# Patient Record
Sex: Female | Born: 1975
Health system: Southern US, Community
[De-identification: ages and names within clinical notes are randomized; demographics above are authoritative.]

## PROBLEM LIST (undated history)

## (undated) DIAGNOSIS — F329 Major depressive disorder, single episode, unspecified: Secondary | ICD-10-CM

## (undated) DIAGNOSIS — E079 Disorder of thyroid, unspecified: Secondary | ICD-10-CM

## (undated) DIAGNOSIS — D689 Coagulation defect, unspecified: Secondary | ICD-10-CM

## (undated) DIAGNOSIS — F419 Anxiety disorder, unspecified: Secondary | ICD-10-CM

## (undated) DIAGNOSIS — K760 Fatty (change of) liver, not elsewhere classified: Secondary | ICD-10-CM

## (undated) DIAGNOSIS — F32A Depression, unspecified: Secondary | ICD-10-CM

## (undated) DIAGNOSIS — C649 Malignant neoplasm of unspecified kidney, except renal pelvis: Secondary | ICD-10-CM

## (undated) DIAGNOSIS — D6851 Activated protein C resistance: Secondary | ICD-10-CM

## (undated) HISTORY — PX: EXTERNAL EAR SURGERY: SHX627

## (undated) HISTORY — DX: Malignant neoplasm of unspecified kidney, except renal pelvis: C64.9

## (undated) HISTORY — DX: Major depressive disorder, single episode, unspecified: F32.9

## (undated) HISTORY — DX: Anxiety disorder, unspecified: F41.9

## (undated) HISTORY — DX: Coagulation defect, unspecified: D68.9

## (undated) HISTORY — DX: Activated protein C resistance: D68.51

## (undated) HISTORY — PX: TYMPANOPLASTY: SHX33

## (undated) HISTORY — DX: Disorder of thyroid, unspecified: E07.9

## (undated) HISTORY — DX: Depression, unspecified: F32.A

## (undated) HISTORY — DX: Fatty (change of) liver, not elsewhere classified: K76.0

---

## 2000-03-23 HISTORY — PX: THYROIDECTOMY: SHX17

## 2014-06-12 LAB — HM MAMMOGRAPHY: HM Mammogram: NORMAL (ref 0–4)

## 2014-09-27 LAB — HM PAP SMEAR

## 2015-04-03 ENCOUNTER — Encounter: Payer: Self-pay | Admitting: Obstetrics & Gynecology

## 2015-09-23 ENCOUNTER — Ambulatory Visit: Payer: 59 | Admitting: Family Medicine

## 2015-09-27 ENCOUNTER — Ambulatory Visit (INDEPENDENT_AMBULATORY_CARE_PROVIDER_SITE_OTHER): Payer: 59 | Admitting: Family Medicine

## 2015-09-27 ENCOUNTER — Encounter: Payer: Self-pay | Admitting: Family Medicine

## 2015-09-27 VITALS — BP 121/78 | HR 98 | Temp 98.1°F | Resp 16 | Ht 67.0 in | Wt 218.0 lb

## 2015-09-27 DIAGNOSIS — E669 Obesity, unspecified: Secondary | ICD-10-CM | POA: Diagnosis not present

## 2015-09-27 DIAGNOSIS — F329 Major depressive disorder, single episode, unspecified: Secondary | ICD-10-CM

## 2015-09-27 DIAGNOSIS — Z8669 Personal history of other diseases of the nervous system and sense organs: Secondary | ICD-10-CM | POA: Diagnosis not present

## 2015-09-27 DIAGNOSIS — E66813 Obesity, class 3: Secondary | ICD-10-CM | POA: Insufficient documentation

## 2015-09-27 DIAGNOSIS — E890 Postprocedural hypothyroidism: Secondary | ICD-10-CM

## 2015-09-27 DIAGNOSIS — L719 Rosacea, unspecified: Secondary | ICD-10-CM

## 2015-09-27 DIAGNOSIS — F32A Depression, unspecified: Secondary | ICD-10-CM

## 2015-09-27 DIAGNOSIS — E039 Hypothyroidism, unspecified: Secondary | ICD-10-CM | POA: Insufficient documentation

## 2015-09-27 LAB — HEPATIC FUNCTION PANEL
ALBUMIN: 4.4 g/dL (ref 3.5–5.2)
ALT: 51 U/L — ABNORMAL HIGH (ref 0–35)
AST: 29 U/L (ref 0–37)
Alkaline Phosphatase: 83 U/L (ref 39–117)
Bilirubin, Direct: 0.1 mg/dL (ref 0.0–0.3)
TOTAL PROTEIN: 6.8 g/dL (ref 6.0–8.3)
Total Bilirubin: 0.6 mg/dL (ref 0.2–1.2)

## 2015-09-27 LAB — BASIC METABOLIC PANEL
BUN: 13 mg/dL (ref 6–23)
CALCIUM: 9.1 mg/dL (ref 8.4–10.5)
CO2: 25 meq/L (ref 19–32)
CREATININE: 0.67 mg/dL (ref 0.40–1.20)
Chloride: 102 mEq/L (ref 96–112)
GFR: 104.76 mL/min (ref >60.00)
GLUCOSE: 93 mg/dL (ref 70–99)
Potassium: 4.2 mEq/L (ref 3.5–5.1)
Sodium: 135 mEq/L (ref 135–145)

## 2015-09-27 LAB — CBC WITH DIFFERENTIAL/PLATELET
BASOS ABS: 0 10*3/uL (ref 0.0–0.1)
Basophils Relative: 0.4 % (ref 0.0–3.0)
EOS ABS: 0 10*3/uL (ref 0.0–0.7)
Eosinophils Relative: 0.6 % (ref 0.0–5.0)
HEMATOCRIT: 44.6 % (ref 36.0–46.0)
HEMOGLOBIN: 15.1 g/dL — AB (ref 12.0–15.0)
LYMPHS PCT: 11.9 % — AB (ref 12.0–46.0)
Lymphs Abs: 0.7 10*3/uL (ref 0.7–4.0)
MCHC: 33.8 g/dL (ref 30.0–36.0)
MCV: 89.4 fl (ref 78.0–100.0)
Monocytes Absolute: 0.7 10*3/uL (ref 0.1–1.0)
Monocytes Relative: 10.8 % (ref 3.0–12.0)
Neutro Abs: 4.7 10*3/uL (ref 1.4–7.7)
Neutrophils Relative %: 76.3 % (ref 43.0–77.0)
Platelets: 280 10*3/uL (ref 150.0–400.0)
RBC: 4.99 Mil/uL (ref 3.87–5.11)
RDW: 13.1 % (ref 11.5–15.5)
WBC: 6.2 10*3/uL (ref 4.0–10.5)

## 2015-09-27 LAB — LIPID PANEL
CHOLESTEROL: 231 mg/dL — AB (ref 0–200)
HDL: 46.7 mg/dL (ref >39.00)
LDL CALC: 147 mg/dL — AB (ref 0–99)
NONHDL: 184.22
Total CHOL/HDL Ratio: 5
Triglycerides: 184 mg/dL — ABNORMAL HIGH (ref 0.0–149.0)
VLDL: 36.8 mg/dL (ref 0.0–40.0)

## 2015-09-27 LAB — TSH: TSH: 1.39 u[IU]/mL (ref 0.35–4.50)

## 2015-09-27 MED ORDER — METRONIDAZOLE 1 % EX GEL: Freq: Every day | CUTANEOUS | Status: DC

## 2015-09-27 MED ORDER — SERTRALINE HCL 25 MG PO TABS: 25.0000 mg | ORAL_TABLET | Freq: Every day | ORAL | Status: DC

## 2015-09-27 NOTE — Progress Notes (Signed)
   Subjective:    Patient ID: Martha Cervantes, female    DOB: 08-18-1975, 40 y.o.   MRN: [id number]  HPI New to establish.  Recently moved from [facility]  Hypothyroid- pt had thyroidectomy due to strong family hx of thyroid cancer.  She is now on Levothyroxine 155mcg daily.  + fatigue.  Denies changes to skin, hair, nails.  Denies constipation.  Depression- chronic problem, currently on Zoloft 25mg  daily.  + family hx of depression.  Pt reports Zoloft is helping w/ sleep and anxiety.  Due for refill on medication.  Skin changes- pt has noticed skin changes on jaw.  Worsens w/ abx use.  Doesn't itch.  + dry, thickened, flaky.  Dad w/ hx of rosacea.  Hx of migraines- pt has not had a headache in over a year.  Previously took a cocktail of Imitrex, Phenergan, Advil.  Review of Systems For ROS see HPI     Objective:   Physical Exam  Constitutional: She is oriented to person, place, and time. She appears well-developed and well-nourished. No distress.  HENT:  Head: Normocephalic and atraumatic.  TMs are scarred and retracted bilaterally  Eyes: Conjunctivae and EOM are normal. Pupils are equal, round, and reactive to light.  Neck: Normal range of motion. Neck supple. No thyromegaly present.  Cardiovascular: Normal rate, regular rhythm, normal heart sounds and intact distal pulses.   No murmur heard. Pulmonary/Chest: Effort normal and breath sounds normal. No respiratory distress.  Abdominal: Soft. She exhibits no distension. There is no tenderness.  Musculoskeletal: She exhibits no edema.  Lymphadenopathy:    She has no cervical adenopathy.  Neurological: She is alert and oriented to person, place, and time.  Skin: Skin is warm and dry. There is erythema (pt w/ rosacea of chin and nose).  Psychiatric: She has a normal mood and affect. Her behavior is normal.  Vitals reviewed.         Assessment & Plan:

## 2015-09-27 NOTE — Assessment & Plan Note (Signed)
New.  Check labs to risk stratify.  Stressed need for healthy diet and regular exercise.  Will follow.

## 2015-09-27 NOTE — Patient Instructions (Signed)
Schedule your complete physical in 6 months We'll notify you of your lab results and make any changes if needed Continue to work on healthy diet and regular exercise- this will also help w/ stress relief! Use the Metrogel once daily before bed- may initially cause increased drying but should even out over time We will refill the thyroid medication once we have your lab results Start a daily Claritin or Zyrtec and see if it improves your ear symptoms Call with any questions or concerns Welcome!  We're glad to have you!!!

## 2015-09-27 NOTE — Assessment & Plan Note (Signed)
New to provider.  Pt has not had a HA in over a year.  She feels starting Zoloft improved these.  Will tx prn.

## 2015-09-27 NOTE — Assessment & Plan Note (Signed)
New.  Pt's facial sxs are not consistent w/ reaction to amoxicillin as she was previously told (she has not been on medication recently).  Appearance is consistent w/ Rosacea- which her father also has.  Start Metrogel and if no improvement will refer to Derm.  Pt expressed understanding and is in agreement w/ plan.

## 2015-09-27 NOTE — Assessment & Plan Note (Signed)
New to provider, ongoing for pt.  Pt reports sxs are well controlled w/ Zoloft.  No med changes at this time.  Refill provided.

## 2015-09-27 NOTE — Assessment & Plan Note (Signed)
New to provider, ongoing for pt.  Pt feels she is recently much more fatigued.  Check TSH level and adjust meds prn.  Pt expressed understanding and is in agreement w/ plan.

## 2015-09-27 NOTE — Progress Notes (Signed)
Pre visit review using our clinic review tool, if applicable. No additional management support is needed unless otherwise documented below in the visit note. 

## 2015-09-30 ENCOUNTER — Encounter: Payer: Self-pay | Admitting: General Practice

## 2015-11-11 ENCOUNTER — Ambulatory Visit (INDEPENDENT_AMBULATORY_CARE_PROVIDER_SITE_OTHER): Payer: 59 | Admitting: Family Medicine

## 2015-11-11 ENCOUNTER — Encounter: Payer: Self-pay | Admitting: Family Medicine

## 2015-11-11 VITALS — BP 126/86 | HR 83 | Temp 98.0°F | Resp 16 | Ht 67.0 in | Wt 223.4 lb

## 2015-11-11 DIAGNOSIS — R51 Headache: Secondary | ICD-10-CM

## 2015-11-11 DIAGNOSIS — R519 Headache, unspecified: Secondary | ICD-10-CM

## 2015-11-11 DIAGNOSIS — G8929 Other chronic pain: Secondary | ICD-10-CM

## 2015-11-11 MED ORDER — CYCLOBENZAPRINE HCL 10 MG PO TABS: 10.0000 mg | ORAL_TABLET | Freq: Three times a day (TID) | ORAL | 0 refills | Status: DC | PRN

## 2015-11-11 MED ORDER — IBUPROFEN 800 MG PO TABS: 800.0000 mg | ORAL_TABLET | Freq: Three times a day (TID) | ORAL | 0 refills | Status: DC | PRN

## 2015-11-11 MED ORDER — SUMATRIPTAN SUCCINATE 50 MG PO TABS: 50.0000 mg | ORAL_TABLET | ORAL | 0 refills | Status: DC | PRN

## 2015-11-11 NOTE — Progress Notes (Signed)
   Subjective:    Patient ID: Martha Cervantes, female    DOB: [birth year], 40 y.o.   MRN: [id number]  HPI Migraines- pt reports she has had HAs since she was a child.  Chocolate is a trigger for pt.  Pt reports since she went to [employer] store in late July she has had a 'bad headache' w/ some dizziness, fatigue.  HA has waxed and waned but it has never resolved.  Pt reports the worst part is the fatigue.  Will develop eye pain when there is a lot of noise and activity.  'when I get one, I get really freaked out and think I have a brain tumor'.  Last physician gave her Phenergan, Depakote, Imitrex, and Advil 800mg  but she reports this did not resolve sxs and she didn't like how it made her feel.  Pt has never seen Neuro.  Pt denies nausea.  Pt reports this occurs ~1x/yr.  sxs improve w/ physical activity.   Review of Systems For ROS see HPI     Objective:   Physical Exam  Constitutional: She is oriented to person, place, and time. She appears well-developed and well-nourished. No distress.  obese  HENT:  Head: Normocephalic and atraumatic.  TMs WNL No TTP over sinuses Minimal nasal congestion  Eyes: Conjunctivae and EOM are normal. Pupils are equal, round, and reactive to light.  Neck: Normal range of motion. Neck supple.  Cardiovascular: Normal rate, regular rhythm, normal heart sounds and intact distal pulses.   Pulmonary/Chest: Effort normal and breath sounds normal. No respiratory distress. She has no wheezes. She has no rales.  Lymphadenopathy:    She has no cervical adenopathy.  Neurological: She is alert and oriented to person, place, and time. She has normal reflexes. No cranial nerve deficit. She exhibits normal muscle tone. Coordination normal.  Psychiatric: She has a normal mood and affect. Her behavior is normal. Judgment and thought content normal.  Vitals reviewed.         Assessment & Plan:  Intractable headache- new to provider, pt has hx of similar.  Pt reports migraines  since childhood but she has had a few of these episodes where she will have intractable headache for >1 month.  No relief w/ tylenol/ibuprofen.  She reports the worst part of these is the overwhelming fatigue and intermittent dizziness.  The headache she's describing may be tension related- start Flexeril.  Imitrex in case of migraines.  Since this is the 1st since her episode last year, a daily prophylaxis seems like overkill.  She has never seen neuro- referral placed.  Reviewed supportive care and red flags that should prompt return.  Pt expressed understanding and is in agreement w/ plan.

## 2015-11-11 NOTE — Progress Notes (Signed)
Pre visit review using our clinic review tool, if applicable. No additional management support is needed unless otherwise documented below in the visit note. 

## 2015-11-11 NOTE — Patient Instructions (Signed)
Follow up as needed We'll call you with your Neuro referral Take 1 imitrex today in combo with the Flexeril tonight and the 800mg  ibuprofen up to 3x/day (take w/ food) Repeat the Imitrex daily until headache resolves Drink plenty of fluids!!!  Dehydration can worsen headaches Start daily Claritin or Zyrtec to improve allergy symptoms Call with any questions or concerns Hang in there!!  You're not crazy!!!

## 2016-01-13 ENCOUNTER — Ambulatory Visit (INDEPENDENT_AMBULATORY_CARE_PROVIDER_SITE_OTHER): Payer: 59 | Admitting: Neurology

## 2016-01-13 ENCOUNTER — Encounter: Payer: Self-pay | Admitting: Neurology

## 2016-01-13 VITALS — BP 112/60 | HR 101 | Ht 67.0 in | Wt 223.0 lb

## 2016-01-13 DIAGNOSIS — G44219 Episodic tension-type headache, not intractable: Secondary | ICD-10-CM | POA: Diagnosis not present

## 2016-01-13 NOTE — Patient Instructions (Addendum)
I think you likely have tension type headaches, but migraine is still possible too. Try to focus on lifestyle modification.  If you would like, we can try a different antidepressant called nortriptyline (which is effective for migraine and tension headache).  Continue flexeril.  Tizanidine is another muscle relaxer too.   Migraine Headache A migraine headache is an intense, throbbing pain on one or both sides of your head. A migraine can last for 30 minutes to several hours. CAUSES  The exact cause of a migraine headache is not always known. However, a migraine may be caused when nerves in the brain become irritated and release chemicals that cause inflammation. This causes pain. Certain things may also trigger migraines, such as:  Alcohol.  Smoking.  Stress.  Menstruation.  Aged cheeses.  Foods or drinks that contain nitrates, glutamate, aspartame, or tyramine.  Lack of sleep.  Chocolate.  Caffeine.  Hunger.  Physical exertion.  Fatigue.  Medicines used to treat chest pain (nitroglycerine), birth control pills, estrogen, and some blood pressure medicines. SIGNS AND SYMPTOMS  Pain on one or both sides of your head.  Pulsating or throbbing pain.  Severe pain that prevents daily activities.  Pain that is aggravated by any physical activity.  Nausea, vomiting, or both.  Dizziness.  Pain with exposure to bright lights, loud noises, or activity.  General sensitivity to bright lights, loud noises, or smells. Before you get a migraine, you may get warning signs that a migraine is coming (aura). An aura may include:  Seeing flashing lights.  Seeing bright spots, halos, or zigzag lines.  Having tunnel vision or blurred vision.  Having feelings of numbness or tingling.  Having trouble talking.  Having muscle weakness. DIAGNOSIS  A migraine headache is often diagnosed based on:  Symptoms.  Physical exam.  A CT scan or MRI of your head. These imaging tests  cannot diagnose migraines, but they can help rule out other causes of headaches. TREATMENT Medicines may be given for pain and nausea. Medicines can also be given to help prevent recurrent migraines.  HOME CARE INSTRUCTIONS  Only take over-the-counter or prescription medicines for pain or discomfort as directed by your health care provider. The use of long-term narcotics is not recommended.  Lie down in a dark, quiet room when you have a migraine.  Keep a journal to find out what may trigger your migraine headaches. For example, write down:  What you eat and drink.  How much sleep you get.  Any change to your diet or medicines.  Limit alcohol consumption.  Quit smoking if you smoke.  Get 7-9 hours of sleep, or as recommended by your health care provider.  Limit stress.  Keep lights dim if bright lights bother you and make your migraines worse. SEEK IMMEDIATE MEDICAL CARE IF:   Your migraine becomes severe.  You have a fever.  You have a stiff neck.  You have vision loss.  You have muscular weakness or loss of muscle control.  You start losing your balance or have trouble walking.  You feel faint or pass out.  You have severe symptoms that are different from your first symptoms. MAKE SURE YOU:   Understand these instructions.  Will watch your condition.  Will get help right away if you are not doing well or get worse.   This information is not intended to replace advice given to you by your health care provider. Make sure you discuss any questions you have with your health care provider.  Document Released: 03/09/2005 Document Revised: 03/30/2014 Document Reviewed: 11/14/2012 Elsevier Interactive Patient Education [employer].

## 2016-01-13 NOTE — Progress Notes (Signed)
NEUROLOGY CONSULTATION NOTE  Decker Miggins MRN: [id number] DOB: Oct 04, 1975  Referring provider: Dr. Birdie Riddle Primary care provider: Dr. Birdie Riddle  Reason for consult:  headache  HISTORY OF PRESENT ILLNESS: Martha Cervantes is a 40 year old right-handed woman with secondary hypothyroidism who presents for migraines.  History obtained by patient and PCP note.  Onset:  She has had headaches since childhood.  In highschool and college, she had severe migraines associated with nausea, vomiting, photophobia and phonophobia.  She hasn't had those in several years.  In March 2017, she developed a headache that lasted 2 weeks.  At that time, she was under increased stress related to her marriage and caring for 2 young children.  She was treated with combination of Depakote, sumatriptan 50mg  and Phenergan, which was ineffective.  Following that, she has since had episodic headache. Location:  Varies (back of head, front, top of head) Quality:  Non-throbbing Intensity:  3-5/10 Aura:  no Prodrome:  no Associated symptoms:  Dizziness, fatigue Duration:  Several hours to days (works within one hour with Flexeril) Frequency:  Once a week (but can last 2 to 3 days sometimes) Triggers/exacerbating factors:  chocolate Relieving factors:  rest Activity:  Able to function with these headaches.  Past NSAIDS:  ibuprofen 800mg  Past analgesics:  Tylenol Past abortive triptans:  sumatriptan Past muscle relaxants:  no Past anti-emetic:  promethazine Past antihypertensive medications:  no Past antidepressant medications:  no Past anticonvulsant medications:  Depakote Past vitamins/Herbal/Supplements:  Magnesium, riboflavin Other past therapies:  no  Current NSAIDS:  no Current analgesics:  no Current triptans:  no Current anti-emetic:  no Current muscle relaxants:  cyclobenzaprine Current anti-anxiolytic:  no Current sleep aide:  no Current Antihypertensive medications:  no Current Antidepressant  medications:  Sertraline 25mg  (makes her drowsy, wants to discontinue) Current Anticonvulsant medications:  no Current Vitamins/Herbal/Supplements:  no Current Antihistamines/Decongestants:  Claritin/Zyrtec Other therapy:  no  Caffeine:  2-3 cups coffee daily Alcohol:  no Smoker:  no Diet:  hydrates Exercise:  no Depression/stress:  controlled Sleep hygiene:  good Family history of headache:  Maternal grandmother, cousin  Labs from July: CBC with WBC 6.2, HGB 15.1, HCT 44.6 and PLT 280; BMP with BUN 13, Cr 0.67 and GFR 104.76; hepatic panel with total bili 0.6, ALP 83, AST 29 and ALT 51.  PAST MEDICAL HISTORY: Past Medical History:  Diagnosis Date  . Depression   . Factor V Leiden ([place])   . Thyroid disease     PAST SURGICAL HISTORY: Past Surgical History:  Procedure Laterality Date  . EXTERNAL EAR SURGERY    . THYROIDECTOMY  2002  . TYMPANOPLASTY      MEDICATIONS: Current Outpatient Prescriptions on File Prior to Visit  Medication Sig Dispense Refill  . cyclobenzaprine (FLEXERIL) 10 MG tablet Take 1 tablet (10 mg total) by mouth 3 (three) times daily as needed for muscle spasms. 45 tablet 0  . levothyroxine (SYNTHROID, LEVOTHROID) 125 MCG tablet Take 125 mcg by mouth daily before breakfast.    . metroNIDAZOLE (METROGEL) 1 % gel Apply topically daily. 60 g 6  . sertraline (ZOLOFT) 25 MG tablet Take 1 tablet (25 mg total) by mouth daily. 30 tablet 6  . SUMAtriptan (IMITREX) 50 MG tablet Take 1 tablet (50 mg total) by mouth every 2 (two) hours as needed for migraine. May repeat in 2 hours if headache persists or recurs. 10 tablet 0   No current facility-administered medications on file prior to visit.     ALLERGIES:  Allergies  Allergen Reactions  . Amoxicillin Rash    FAMILY HISTORY: Family History  Problem Relation Age of Onset  . Hyperlipidemia Mother   . Depression Mother   . Factor V Leiden deficiency Father   . Cancer Sister     thyroid  . Diabetes  Maternal Grandmother   . Cancer Maternal Grandmother     thyroid  . Diabetes Maternal Grandfather   . Diabetes Paternal Grandmother   . Diabetes Paternal Grandfather   . Cancer Sister     thyroid    SOCIAL HISTORY: Social History   Social History  . Marital status: Married    Spouse name: N/A  . Number of children: N/A  . Years of education: N/A   Occupational History  . Not on file.   Social History Main Topics  . Smoking status: Never Smoker  . Smokeless tobacco: Never Used  . Alcohol use No  . Drug use: No  . Sexual activity: Not on file   Other Topics Concern  . Not on file   Social History Narrative  . No narrative on file    REVIEW OF SYSTEMS: Constitutional: No fevers, chills, or sweats, no generalized fatigue, change in appetite Eyes: No visual changes, double vision, eye pain Ear, nose and throat: No hearing loss, ear pain, nasal congestion, sore throat Cardiovascular: No chest pain, palpitations Respiratory:  No shortness of breath at rest or with exertion, wheezes GastrointestinaI: No nausea, vomiting, diarrhea, abdominal pain, fecal incontinence Genitourinary:  No dysuria, urinary retention or frequency Musculoskeletal:  No neck pain, back pain Integumentary: No rash, pruritus, skin lesions Neurological: as above Psychiatric: No depression, insomnia, anxiety Endocrine: No palpitations, fatigue, diaphoresis, mood swings, change in appetite, change in weight, increased thirst Hematologic/Lymphatic:  No purpura, petechiae. Allergic/Immunologic: no itchy/runny eyes, nasal congestion, recent allergic reactions, rashes  PHYSICAL EXAM: Vitals:   01/13/16 0938  BP: 112/60  Pulse: (!) 101   General: No acute distress.  Patient appears well-groomed.  Head:  Normocephalic/atraumatic Eyes:  fundi examined but not visualized Neck: supple, no paraspinal tenderness, full range of motion Back: No paraspinal tenderness Heart: regular rate and rhythm Lungs:  Clear to auscultation bilaterally. Vascular: No carotid bruits. Neurological Exam: Mental status: alert and oriented to person, place, and time, recent and remote memory intact, fund of knowledge intact, attention and concentration intact, speech fluent and not dysarthric, language intact. Cranial nerves: CN I: not tested CN II: pupils equal, round and reactive to light, visual fields intact CN III, IV, VI:  full range of motion, no nystagmus, no ptosis CN V: facial sensation intact CN VII: upper and lower face symmetric CN VIII: hearing intact CN IX, X: gag intact, uvula midline CN XI: sternocleidomastoid and trapezius muscles intact CN XII: tongue midline Bulk & Tone: normal, no fasciculations. Motor:  5/5 throughout Sensation: temperature and vibration sensation intact. Deep Tendon Reflexes:  2+ throughout, toes downgoing.  Finger to nose testing:  Without dysmetria.  Heel to shin:  Without dysmetria.  Gait:  Normal station and stride.  Able to turn and tandem walk. Romberg negative.  IMPRESSION: Tension-type headaches (although dizziness may suggest migraine).  Her exam is unremarkable.  Headaches themselves are mild to moderate intensity.  I don;t suspect a secondary intracranial etiology to warrant imaging.  PLAN: 1.  She wishes to try lifestyle modification first:  Exercise, diet, etc. 2.  She is planning on weaning off sertraline.  Other option would be nortriptyline (which is effective for both migraine  and tension-type headache) 3.  Follow up in 3 months.  Thank you for allowing me to take part in the care of this patient.  Metta Clines, DO  CC:  [name], MD

## 2016-03-30 ENCOUNTER — Ambulatory Visit (INDEPENDENT_AMBULATORY_CARE_PROVIDER_SITE_OTHER): Payer: 59 | Admitting: Family Medicine

## 2016-03-30 ENCOUNTER — Encounter: Payer: Self-pay | Admitting: Family Medicine

## 2016-03-30 VITALS — BP 112/60 | HR 95 | Temp 98.1°F | Resp 16 | Ht 67.0 in | Wt 230.0 lb

## 2016-03-30 DIAGNOSIS — Z0000 Encounter for general adult medical examination without abnormal findings: Secondary | ICD-10-CM | POA: Diagnosis not present

## 2016-03-30 DIAGNOSIS — L719 Rosacea, unspecified: Secondary | ICD-10-CM

## 2016-03-30 LAB — CBC WITH DIFFERENTIAL/PLATELET
Basophils Absolute: 0 10*3/uL (ref 0.0–0.1)
Basophils Relative: 0.4 % (ref 0.0–3.0)
EOS ABS: 0.1 10*3/uL (ref 0.0–0.7)
Eosinophils Relative: 0.9 % (ref 0.0–5.0)
HCT: 42.4 % (ref 36.0–46.0)
Hemoglobin: 14.4 g/dL (ref 12.0–15.0)
LYMPHS ABS: 1.1 10*3/uL (ref 0.7–4.0)
Lymphocytes Relative: 14.6 % (ref 12.0–46.0)
MCHC: 33.9 g/dL (ref 30.0–36.0)
MCV: 90.2 fl (ref 78.0–100.0)
Monocytes Absolute: 1.1 10*3/uL — ABNORMAL HIGH (ref 0.1–1.0)
Monocytes Relative: 14.4 % — ABNORMAL HIGH (ref 3.0–12.0)
NEUTROS ABS: 5.1 10*3/uL (ref 1.4–7.7)
NEUTROS PCT: 69.7 % (ref 43.0–77.0)
PLATELETS: 251 10*3/uL (ref 150.0–400.0)
RBC: 4.7 Mil/uL (ref 3.87–5.11)
RDW: 13.4 % (ref 11.5–15.5)
WBC: 7.3 10*3/uL (ref 4.0–10.5)

## 2016-03-30 LAB — BASIC METABOLIC PANEL
BUN: 11 mg/dL (ref 6–23)
CHLORIDE: 103 meq/L (ref 96–112)
CO2: 24 meq/L (ref 19–32)
Calcium: 9.5 mg/dL (ref 8.4–10.5)
Creatinine, Ser: 0.76 mg/dL (ref 0.40–1.20)
GFR: 90.33 mL/min (ref >60.00)
GLUCOSE: 88 mg/dL (ref 70–99)
POTASSIUM: 4.3 meq/L (ref 3.5–5.1)
Sodium: 137 mEq/L (ref 135–145)

## 2016-03-30 LAB — HEPATIC FUNCTION PANEL
ALK PHOS: 88 U/L (ref 39–117)
ALT: 31 U/L (ref 0–35)
AST: 18 U/L (ref 0–37)
Albumin: 4.4 g/dL (ref 3.5–5.2)
BILIRUBIN DIRECT: 0.1 mg/dL (ref 0.0–0.3)
TOTAL PROTEIN: 6.9 g/dL (ref 6.0–8.3)
Total Bilirubin: 0.5 mg/dL (ref 0.2–1.2)

## 2016-03-30 LAB — LIPID PANEL
CHOLESTEROL: 203 mg/dL — AB (ref 0–200)
HDL: 48.6 mg/dL (ref >39.00)
LDL Cholesterol: 134 mg/dL — ABNORMAL HIGH (ref 0–99)
NonHDL: 154.87
Total CHOL/HDL Ratio: 4
Triglycerides: 106 mg/dL (ref 0.0–149.0)
VLDL: 21.2 mg/dL (ref 0.0–40.0)

## 2016-03-30 LAB — TSH: TSH: 2.61 u[IU]/mL (ref 0.35–4.50)

## 2016-03-30 LAB — VITAMIN D 25 HYDROXY (VIT D DEFICIENCY, FRACTURES): VITD: 20.89 ng/mL — ABNORMAL LOW (ref 30.00–100.00)

## 2016-03-30 NOTE — Progress Notes (Signed)
   Subjective:    Patient ID: Martha Cervantes, female    DOB: [birth year], 41 y.o.   MRN: [id number]  HPI CPE- UTD on pap (due 2019), had baseline mammo 2016.  Too young for colonoscopy.  UTD on Tdap.  Depression- stopped Zoloft b/c she was feeling very groggy on it.  Is feeling good since stopping medication  Rosacea- pt is interested in Derm referral b/c she could not afford the Metrogel   Review of Systems Patient reports no vision/ hearing changes, adenopathy,fever, weight change,  persistant/recurrent hoarseness , swallowing issues, chest pain, palpitations, edema, persistant/recurrent cough, hemoptysis, dyspnea (rest/exertional/paroxysmal nocturnal), gastrointestinal bleeding (melena, rectal bleeding), abdominal pain, significant heartburn, bowel changes, GU symptoms (dysuria, hematuria, incontinence), Gyn symptoms (abnormal  bleeding, pain),  syncope, focal weakness, memory loss, numbness & tingling, skin/hair/nail changes, abnormal bruising or bleeding, anxiety, or depression.     Objective:   Physical Exam General Appearance:    Alert, cooperative, no distress, appears stated age  Head:    Normocephalic, without obvious abnormality, atraumatic  Eyes:    PERRL, conjunctiva/corneas clear, EOM's intact, fundi    benign, both eyes  Ears:    Normal TM's and external ear canals, both ears  Nose:   Nares normal, septum midline, mucosa normal, no drainage    or sinus tenderness  Throat:   Lips, mucosa, and tongue normal; teeth and gums normal  Neck:   Supple, symmetrical, trachea midline, no adenopathy;    Thyroid: no enlargement/tenderness/nodules  Back:     Symmetric, no curvature, ROM normal, no CVA tenderness  Lungs:     Clear to auscultation bilaterally, respirations unlabored  Chest Wall:    No tenderness or deformity   Heart:    Regular rate and rhythm, S1 and S2 normal, no murmur, rub   or gallop  Breast Exam:    Deferred to GYN  Abdomen:     Soft, non-tender, bowel sounds  active all four quadrants,    no masses, no organomegaly  Genitalia:    Deferred to GYN  Rectal:    Extremities:   Extremities normal, atraumatic, no cyanosis or edema  Pulses:   2+ and symmetric all extremities  Skin:   Skin color, texture, turgor normal, rosacea- worst on chin  Lymph nodes:   Cervical, supraclavicular, and axillary nodes normal  Neurologic:   CNII-XII intact, normal strength, sensation and reflexes    throughout          Assessment & Plan:

## 2016-03-30 NOTE — Assessment & Plan Note (Signed)
Refer to derm since metrogel was too expensive

## 2016-03-30 NOTE — Patient Instructions (Signed)
Follow up in 6 months to check weight loss progress We'll notify you of your lab results and make any changes if needed Continue to work on healthy diet and regular exercise- you can do it!!! We'll call you with your Derm referral for the Rosacea Call with any questions or concerns Happy New Year!!!

## 2016-03-30 NOTE — Progress Notes (Signed)
Pre visit review using our clinic review tool, if applicable. No additional management support is needed unless otherwise documented below in the visit note. 

## 2016-03-30 NOTE — Assessment & Plan Note (Signed)
Pt's PE WNL w/ exception of rosacea and obesity.  UTD on pap, mammo, Tdap.  Check labs.  Adjust meds prn.  Stressed need for healthy diet and regular exercise.  Anticipatory guidance provided.

## 2016-03-31 ENCOUNTER — Other Ambulatory Visit: Payer: Self-pay | Admitting: General Practice

## 2016-03-31 MED ORDER — VITAMIN D (ERGOCALCIFEROL) 1.25 MG (50000 UNIT) PO CAPS: 50000.0000 [IU] | ORAL_CAPSULE | ORAL | 0 refills | Status: DC

## 2016-04-03 ENCOUNTER — Telehealth: Payer: Self-pay | Admitting: Family Medicine

## 2016-04-03 MED ORDER — SULFAMETHOXAZOLE-TRIMETHOPRIM 800-160 MG PO TABS: 1.0000 | ORAL_TABLET | Freq: Two times a day (BID) | ORAL | 0 refills | Status: AC

## 2016-04-03 NOTE — Telephone Encounter (Signed)
Please call pt and get more information from pt (sxs, presence of fever, etc)

## 2016-04-03 NOTE — Telephone Encounter (Signed)
Pt states that she was just in for visit on Monday and states that she has a cold. Pt now states that she thinks it has turned into sinus infection and asking if something could be called in for her, [facility] on [address]

## 2016-04-03 NOTE — Telephone Encounter (Signed)
No fever, no cough -  But she is very congestion and her face hurts/has a lot of pressure in her forehead/nasal passages.

## 2016-04-03 NOTE — Telephone Encounter (Signed)
Could you please call pt to verify current symptoms

## 2016-04-03 NOTE — Telephone Encounter (Signed)
Medication filled to pharmacy as requested.  Pt called and informed of PCP recommendations. Stated an understanding.

## 2016-04-03 NOTE — Telephone Encounter (Signed)
Bactrim DS BID x10 days and if no improvement will need return OV

## 2016-04-16 ENCOUNTER — Other Ambulatory Visit: Payer: Self-pay | Admitting: General Practice

## 2016-04-16 MED ORDER — LEVOTHYROXINE SODIUM 125 MCG PO TABS: 125.0000 ug | ORAL_TABLET | Freq: Every day | ORAL | 0 refills | Status: DC

## 2016-04-30 ENCOUNTER — Ambulatory Visit: Payer: 59 | Admitting: Neurology

## 2016-05-05 DIAGNOSIS — L719 Rosacea, unspecified: Secondary | ICD-10-CM | POA: Diagnosis not present

## 2016-05-05 DIAGNOSIS — D1800 Hemangioma unspecified site: Secondary | ICD-10-CM | POA: Diagnosis not present

## 2016-05-05 DIAGNOSIS — D225 Melanocytic nevi of trunk: Secondary | ICD-10-CM | POA: Diagnosis not present

## 2016-05-22 ENCOUNTER — Other Ambulatory Visit: Payer: Self-pay | Admitting: General Practice

## 2016-05-22 MED ORDER — LEVOTHYROXINE SODIUM 125 MCG PO TABS: 125.0000 ug | ORAL_TABLET | Freq: Every day | ORAL | 0 refills | Status: DC

## 2016-07-07 ENCOUNTER — Other Ambulatory Visit: Payer: Self-pay | Admitting: Family Medicine

## 2016-07-07 ENCOUNTER — Other Ambulatory Visit: Payer: Self-pay

## 2016-07-07 ENCOUNTER — Ambulatory Visit (INDEPENDENT_AMBULATORY_CARE_PROVIDER_SITE_OTHER): Payer: 59 | Admitting: Family Medicine

## 2016-07-07 ENCOUNTER — Encounter: Payer: Self-pay | Admitting: Family Medicine

## 2016-07-07 VITALS — BP 118/68 | HR 108 | Resp 17 | Ht 67.0 in | Wt 221.5 lb

## 2016-07-07 DIAGNOSIS — N644 Mastodynia: Secondary | ICD-10-CM

## 2016-07-07 NOTE — Patient Instructions (Signed)
Follow up as needed/scheduled We'll call you with your Imaging appt Ibuprofen as needed for breast pain Wear good, supportive bras to decrease pain Call with any questions or concerns Hang in there!!!

## 2016-07-07 NOTE — Progress Notes (Signed)
   Subjective:    Patient ID: Martha Cervantes, female    DOB: 05/18/1975, 41 y.o.   MRN: [id number]  HPI Breast pain- pt reports she has recently been having increased breast pain prior to periods.  Pt reports L breast in particular is painful and 'it's lumpy'.  Had baseline mammo at age 5 which was normal.  Pt reports high caffeine intake.   Review of Systems For ROS see HPI     Objective:   Physical Exam  Constitutional: She appears well-developed and well-nourished. No distress.  HENT:  Head: Normocephalic and atraumatic.  Pulmonary/Chest: Right breast exhibits no inverted nipple, no mass, no nipple discharge, no skin change and no tenderness. Left breast exhibits tenderness (TTP over upper quadrants w/o obvious palpable mass). Left breast exhibits no inverted nipple, no mass, no nipple discharge and no skin change.  Vitals reviewed.         Assessment & Plan:  Breast pain- new.  Pt had baseline mammo at 37 that was normal but she reports recently she has been having increased breast pain, L>R, and they intermittently feel 'lumpy'.  Due to pts' concerns, will get diagnostic mammo to assess.

## 2016-07-07 NOTE — Progress Notes (Signed)
Pre visit review using our clinic review tool, if applicable. No additional management support is needed unless otherwise documented below in the visit note. 

## 2016-07-21 ENCOUNTER — Ambulatory Visit
Admission: RE | Admit: 2016-07-21 | Discharge: 2016-07-21 | Disposition: A | Discharge status: Home / Self Care | Payer: 59 | Source: Ambulatory Visit | Attending: Family Medicine | Admitting: Family Medicine

## 2016-07-21 DIAGNOSIS — N644 Mastodynia: Secondary | ICD-10-CM

## 2016-07-21 DIAGNOSIS — N6489 Other specified disorders of breast: Secondary | ICD-10-CM | POA: Diagnosis not present

## 2016-07-21 DIAGNOSIS — R928 Other abnormal and inconclusive findings on diagnostic imaging of breast: Secondary | ICD-10-CM | POA: Diagnosis not present

## 2016-09-16 ENCOUNTER — Other Ambulatory Visit: Payer: Self-pay | Admitting: Family Medicine

## 2016-11-11 ENCOUNTER — Ambulatory Visit (INDEPENDENT_AMBULATORY_CARE_PROVIDER_SITE_OTHER): Payer: Self-pay | Admitting: Physician Assistant

## 2016-11-11 ENCOUNTER — Encounter: Payer: Self-pay | Admitting: Physician Assistant

## 2016-11-11 VITALS — BP 110/80 | HR 98 | Temp 98.7°F | Resp 14 | Ht 67.0 in | Wt 228.0 lb

## 2016-11-11 DIAGNOSIS — H109 Unspecified conjunctivitis: Secondary | ICD-10-CM

## 2016-11-11 MED ORDER — POLYMYXIN B-TRIMETHOPRIM 10000-0.1 UNIT/ML-% OP SOLN: 1.0000 [drp] | Freq: Four times a day (QID) | OPHTHALMIC | 0 refills | Status: DC

## 2016-11-11 NOTE — Progress Notes (Signed)
Pre visit review using our clinic review tool, if applicable. No additional management support is needed unless otherwise documented below in the visit note. 

## 2016-11-11 NOTE — Progress Notes (Signed)
   Patient presents to clinic today c/o redness and irritation to R eye over the past 1.5 weeks. Denies known trauma or injury. Denies sick contact. Denies fever, chills, malaise or fatigue. Notes crusting of the eye each morning over the past few days.   Past Medical History:  Diagnosis Date  . Depression   . Factor V Leiden ([place])   . Thyroid disease     Current Outpatient Prescriptions on File Prior to Visit  Medication Sig Dispense Refill  . levothyroxine (SYNTHROID, LEVOTHROID) 125 MCG tablet TAKE 1 TABLET DAILY BEFORE BREAKFAST 90 tablet 0   No current facility-administered medications on file prior to visit.     Allergies  Allergen Reactions  . Amoxicillin Rash    Family History  Problem Relation Age of Onset  . Hyperlipidemia Mother   . Depression Mother   . Factor V Leiden deficiency Father   . Cancer Sister        thyroid  . Diabetes Maternal Grandmother   . Cancer Maternal Grandmother        thyroid  . Diabetes Maternal Grandfather   . Diabetes Paternal Grandmother   . Diabetes Paternal Grandfather   . Cancer Sister        thyroid    Social History   Social History  . Marital status: Married    Spouse name: N/A  . Number of children: N/A  . Years of education: N/A   Social History Main Topics  . Smoking status: Never Smoker  . Smokeless tobacco: Never Used  . Alcohol use No  . Drug use: No  . Sexual activity: Not Asked   Other Topics Concern  . None   Social History Narrative  . None   Review of Systems - See HPI.  All other ROS are negative.  BP 110/80   Pulse 98   Temp 98.7 F (37.1 C) (Oral)   Resp 14   Ht 5\' 7"  (1.702 m)   Wt 228 lb (103.4 kg)   SpO2 98%   BMI 35.71 kg/m   Physical Exam  Constitutional: She is oriented to person, place, and time and well-developed, well-nourished, and in no distress.  HENT:  Head: Normocephalic and atraumatic.  Right Ear: Tympanic membrane normal.  Left Ear: Tympanic membrane normal.  Eyes:  Pupils are equal, round, and reactive to light. EOM are normal. Right eye exhibits discharge. Right eye exhibits no chemosis, no exudate and no hordeolum. No foreign body present in the right eye. Left eye exhibits no chemosis, no discharge, no exudate and no hordeolum. No foreign body present in the left eye. Right conjunctiva is injected.  Neck: Neck supple.  Cardiovascular: Normal rate, regular rhythm, normal heart sounds and intact distal pulses.   Pulmonary/Chest: Effort normal and breath sounds normal. No respiratory distress. She has no wheezes. She has no rales. She exhibits no tenderness.  Neurological: She is alert and oriented to person, place, and time.  Skin: Skin is warm and dry. No rash noted.  Psychiatric: Affect normal.  Vitals reviewed.  Assessment/Plan: 1. Bacterial conjunctivitis Start polytrim OP as directed. Supportive measures discussed. Follow-up if not quickly improving.    Leeanne Rio, PA-C

## 2016-11-11 NOTE — Patient Instructions (Signed)
Please use antibiotics as directed. No contacts! Avoid rubbing the eye. You can place a warm compress over the area to help promote drainage. If symptoms are not improving with topical antibiotics, give me a call.

## 2016-11-12 ENCOUNTER — Telehealth: Payer: Self-pay | Admitting: Family Medicine

## 2016-11-12 NOTE — Telephone Encounter (Signed)
Have her continue warm compresses and antibiotic over the next 24 hours. Add on a Benadryl or Claritin to help with swelling/inflammation. If not improving over 24 hours or anything worsens she will need to either go to the ER or see Ophthalmology.

## 2016-11-12 NOTE — Telephone Encounter (Signed)
Advised patient of Martha Cervantes recommendations. Advised her to continue warm compresses, continue antibiotic eye drops and add Benadryl or Claritin for swelling/inflammation. If sxs worsens will need to go to ER or opthalmology

## 2016-11-12 NOTE — Telephone Encounter (Signed)
Pt states after 24hrs of using eye drops her eye has gotten worse and asking what should she do, please advise

## 2016-11-12 NOTE — Telephone Encounter (Signed)
Spoke with patient and she has not been using a warm compress on the eye/area. She has been using the eye drops. The eye is more swollen around the eye lid and increase of redness of eye. I did advised to use the warm compress. Please advise

## 2016-11-13 ENCOUNTER — Telehealth: Payer: Self-pay | Admitting: Family Medicine

## 2016-11-13 MED ORDER — DOXYCYCLINE HYCLATE 100 MG PO CAPS: 100.0000 mg | ORAL_CAPSULE | Freq: Two times a day (BID) | ORAL | 0 refills | Status: DC

## 2016-11-13 NOTE — Telephone Encounter (Signed)
LMOVM for return call.  

## 2016-11-13 NOTE — Telephone Encounter (Signed)
Pt calling back today stating that her eye is still not getting better. Pt states that due to not having insurance that she is unable to go to ER and states that she was hoping this could be treated by pcp. Pt did get upset stating that she feels like she is being pasted off to the ER.and wants to know if an abx could be called in

## 2016-11-13 NOTE — Telephone Encounter (Signed)
Pt called back, gave her Cody's recommendations, pt states and understanding and said thank you.

## 2016-11-13 NOTE — Telephone Encounter (Signed)
I have sent in a prescription for doxycycline. Take as directed. Would have her follow-up Monday for reassessment if not significantly improved. If anything worsens, she has to be seen over the weekend. I certainly do not want to "paste" her off -- just making sure nothing more serious is present if symptoms were to worsen.

## 2016-11-13 NOTE — Telephone Encounter (Signed)
disregard

## 2016-11-13 NOTE — Addendum Note (Signed)
Addended by: Brunetta Jeans on: 11/13/2016 09:54 AM   Modules accepted: Orders

## 2016-11-16 ENCOUNTER — Ambulatory Visit (INDEPENDENT_AMBULATORY_CARE_PROVIDER_SITE_OTHER): Payer: Self-pay | Admitting: Family Medicine

## 2016-11-16 ENCOUNTER — Encounter: Payer: Self-pay | Admitting: Family Medicine

## 2016-11-16 VITALS — BP 114/84 | HR 80 | Temp 98.3°F | Resp 16 | Ht 67.0 in | Wt 225.0 lb

## 2016-11-16 DIAGNOSIS — H109 Unspecified conjunctivitis: Secondary | ICD-10-CM

## 2016-11-16 NOTE — Progress Notes (Signed)
Pre visit review using our clinic review tool, if applicable. No additional management support is needed unless otherwise documented below in the visit note. 

## 2016-11-16 NOTE — Progress Notes (Signed)
   Subjective:    Patient ID: Martha Cervantes, female    DOB: 18-Jul-1975, 41 y.o.   MRN: [id number]  HPI Eye infxn- pt was seen 5 days ago and started on Polytrim.  Pt called the next day indicating that there was no improvement.  She was then started on oral Doxy.  Pt thought things were getting better yesterday but 'the bubble is back today'.     Review of Systems For ROS see HPI     Objective:   Physical Exam  Constitutional: She is oriented to person, place, and time. She appears well-developed and well-nourished. No distress.  HENT:  Head: Normocephalic and atraumatic.  Nose: Nose normal.  Mouth/Throat: Oropharynx is clear and moist.  No TTP over sinuses TMs WNL  Eyes: Pupils are equal, round, and reactive to light. EOM are normal. Right eye exhibits no discharge. Left eye exhibits no discharge. No scleral icterus.  R conjunctival redness, lateral corner much more injected than rest of eye.  Limbic sparing.  No visual changes  Neurological: She is alert and oriented to person, place, and time.  Skin: Skin is warm and dry.  Psychiatric:  Pt is tearful, very anxious  Vitals reviewed.         Assessment & Plan:  Conjunctivitis- new to provider, ongoing for pt x2 weeks.  No improvement w/ topical polytrim or oral Doxy.  Pt needs more extensive eye exam.  Called Dr Oswaldo Conroy office and she is able to work pt in this afternoon.  Pt expressed understanding and is in agreement w/ plan.

## 2016-11-16 NOTE — Patient Instructions (Signed)
Dr Oswaldo Conroy is able to see you today at 2:45pm [facility], [address], [phone]) Continue to wear your glasses and avoid contact lenses Try not to rub your eye Call with any questions or concerns Hang in there!!

## 2016-11-22 ENCOUNTER — Other Ambulatory Visit: Payer: Self-pay | Admitting: Family Medicine

## 2017-01-06 ENCOUNTER — Ambulatory Visit (INDEPENDENT_AMBULATORY_CARE_PROVIDER_SITE_OTHER): Payer: 59 | Admitting: Family Medicine

## 2017-01-06 ENCOUNTER — Encounter: Payer: Self-pay | Admitting: Family Medicine

## 2017-01-06 VITALS — BP 116/83 | HR 84 | Temp 98.7°F | Resp 16 | Ht 67.0 in | Wt 228.4 lb

## 2017-01-06 DIAGNOSIS — Z23 Encounter for immunization: Secondary | ICD-10-CM | POA: Diagnosis not present

## 2017-01-06 DIAGNOSIS — E890 Postprocedural hypothyroidism: Secondary | ICD-10-CM

## 2017-01-06 DIAGNOSIS — F331 Major depressive disorder, recurrent, moderate: Secondary | ICD-10-CM | POA: Diagnosis not present

## 2017-01-06 LAB — TSH: TSH: 2.07 u[IU]/mL (ref 0.35–4.50)

## 2017-01-06 MED ORDER — FLUOXETINE HCL 10 MG PO TABS: 10.0000 mg | ORAL_TABLET | Freq: Every day | ORAL | 3 refills | Status: DC

## 2017-01-06 NOTE — Addendum Note (Signed)
Addended by: Midge Minium on: 01/06/2017 01:50 PM   Modules accepted: Orders

## 2017-01-06 NOTE — Assessment & Plan Note (Signed)
Deteriorated.  Pt is now struggling w/ increased anxiety and depression.  Did not like that Zoloft made her feel like a 'zombie' and previously did not feel any relief w/ Paxil or Lexapro.  Due to her fatigue and low motivation, will start Prozac.  Will follow closely

## 2017-01-06 NOTE — Progress Notes (Signed)
   Subjective:    Patient ID: Artis Delay, female    DOB: Aug 18, 1975, 41 y.o.   MRN: [id number]  HPI Hypothyroid- chronic problem.  Has not had TSH checked recently  Depression- pt stopped her Zoloft in January b/c she thought it was making her groggy.  Pt reports that her eye infxn this summer 'kicked off her anxiety' and since then, she has 'not been able to turn off her anxiety'.  + dizziness.  + fatigue.  Difficulty falling asleep.  + racing thoughts.  Difficulty w/ task completion.  Previously on Paxil and Lexapro w/o improvement.   Review of Systems For ROS see HPI     Objective:   Physical Exam  Constitutional: She is oriented to person, place, and time. She appears well-developed and well-nourished. No distress.  obese  HENT:  Head: Normocephalic and atraumatic.  Neurological: She is alert and oriented to person, place, and time.  Skin: Skin is warm and dry.  Psychiatric: Her behavior is normal. Thought content normal.  Obviously anxious w/ rapid speech  Vitals reviewed.         Assessment & Plan:

## 2017-01-06 NOTE — Patient Instructions (Signed)
Follow up in 4-6 weeks to recheck mood We'll notify you of your lab results and make any changes if needed Start the Prozac 10mg  daily- this is 1/2 the starting dose of 20mg  daily Continue to work on healthy diet and regular exercise- you can do it!!! Call with any questions or concerns Hang in there!  We'll get this right!!

## 2017-01-06 NOTE — Assessment & Plan Note (Signed)
Chronic problem.  Labs tend to be WNL but pt is having excessive fatigue so will check labs to determine if dose adjustment is needed.  Pt expressed understanding and is in agreement w/ plan.

## 2017-01-07 ENCOUNTER — Encounter: Payer: Self-pay | Admitting: General Practice

## 2017-01-22 ENCOUNTER — Other Ambulatory Visit: Payer: Self-pay | Admitting: Family Medicine

## 2017-01-22 MED ORDER — LEVOTHYROXINE SODIUM 125 MCG PO TABS: 125.0000 ug | ORAL_TABLET | Freq: Every day | ORAL | 0 refills | Status: DC

## 2017-04-09 ENCOUNTER — Ambulatory Visit: Payer: Self-pay | Admitting: *Deleted

## 2017-04-09 ENCOUNTER — Other Ambulatory Visit: Payer: Self-pay

## 2017-04-09 ENCOUNTER — Ambulatory Visit: Payer: BLUE CROSS/BLUE SHIELD | Admitting: Physician Assistant

## 2017-04-09 ENCOUNTER — Encounter: Payer: Self-pay | Admitting: Physician Assistant

## 2017-04-09 VITALS — BP 100/70 | HR 113 | Temp 98.7°F | Resp 14 | Ht 67.0 in | Wt 228.0 lb

## 2017-04-09 DIAGNOSIS — G44209 Tension-type headache, unspecified, not intractable: Secondary | ICD-10-CM

## 2017-04-09 NOTE — Progress Notes (Signed)
Patient presents to clinic today c/o neck aches and swollen glands that began 4 days ago.  She feels that she has fullness in her head but not nasal congestion.  She denies fevers, but has occasional chills.  She does endorse fatigue.  Her husband has respiratory symptoms currently.  She complains of a posterior headache that radiates down her neck and is 5/10.  She has a history of tension headaches that she takes a muscle relaxer for which she has tried once.  This relieved the headache but still feels stiff.  She denies cough, ear fullness, sinus pressure.  She denies nausea, vomiting, dizziness.        She also has a cyst on her left medial wrist.  She does not know when this appeared.  It is not painful or swollen.  It is not affecting joint mobility.      Past Medical History:  Diagnosis Date  . Depression   . Factor V Leiden ([place])   . Thyroid disease     Current Outpatient Medications on File Prior to Visit  Medication Sig Dispense Refill  . Cholecalciferol (VITAMIN D3) 10000 units TABS Take 1 tablet by mouth daily.    Marland Kitchen FLUoxetine (PROZAC) 10 MG tablet Take 1 tablet (10 mg total) by mouth daily. 30 tablet 3  . levothyroxine (SYNTHROID, LEVOTHROID) 125 MCG tablet Take 1 tablet (125 mcg total) by mouth daily before breakfast. 90 tablet 0   No current facility-administered medications on file prior to visit.     Allergies  Allergen Reactions  . Amoxicillin Rash    Family History  Problem Relation Age of Onset  . Hyperlipidemia Mother   . Depression Mother   . Factor V Leiden deficiency Father   . Cancer Sister        thyroid  . Diabetes Maternal Grandmother   . Cancer Maternal Grandmother        thyroid  . Diabetes Maternal Grandfather   . Diabetes Paternal Grandmother   . Diabetes Paternal Grandfather   . Cancer Sister        thyroid    Social History   Socioeconomic History  . Marital status: Married    Spouse name: None  . Number of children: None  . Years  of education: None  . Highest education level: None  Social Needs  . Financial resource strain: None  . Food insecurity - worry: None  . Food insecurity - inability: None  . Transportation needs - medical: None  . Transportation needs - non-medical: None  Occupational History  . None  Tobacco Use  . Smoking status: Never Smoker  . Smokeless tobacco: Never Used  Substance and Sexual Activity  . Alcohol use: No  . Drug use: No  . Sexual activity: None  Other Topics Concern  . None  Social History Narrative  . None    Review of Systems - See HPI.  All other ROS are negative.  BP 100/70   Pulse (!) 113   Temp 98.7 F (37.1 C) (Oral)   Resp 14   Ht 5\' 7"  (1.702 m)   Wt 228 lb (103.4 kg)   SpO2 98%   BMI 35.71 kg/m   Physical Exam  Constitutional: She is oriented to person, place, and time and well-developed, well-nourished, and in no distress. No distress.  HENT:  Head: Normocephalic and atraumatic.  Right Ear: External ear and ear canal normal. Tympanic membrane is scarred.  Left Ear: External ear and  ear canal normal. Tympanic membrane is scarred.  Mouth/Throat: No oropharyngeal exudate.  Eyes: Conjunctivae and EOM are normal. Pupils are equal, round, and reactive to light.  Neck: Neck supple. No JVD present. No spinous process tenderness present. No Brudzinski's sign noted. No thyroid mass and no thyromegaly present.  Full ROM but with minimal stiffness, tenderness.    Cardiovascular: Normal rate and regular rhythm. Exam reveals no gallop.  No murmur heard. Pulmonary/Chest: Effort normal and breath sounds normal. She has no wheezes.  Lymphadenopathy:    She has no cervical adenopathy.  Neurological: She is alert and oriented to person, place, and time. No cranial nerve deficit.  Skin: Skin is warm and dry. No rash noted.  Psychiatric: Affect normal.   Assessment/Plan: 1. Tension headache Recommend patient continue her muscle relaxer (cyclobenzaprine) to relax  tension in her neck.  Apply warm compresses to neck as needed.  Follow-up if symptoms worsen, do not improve, or new symptoms.    Leticia Penna, RN

## 2017-04-09 NOTE — Patient Instructions (Signed)
Please keep well-hydrated and get plenty of rest. Take your Flexeril in the evening for muscle tension. Apply heating pad and icy hot to the area. If symptoms are not improving, please come see [place].

## 2017-04-09 NOTE — Telephone Encounter (Signed)
Pt c/o swollen lymph nodes in her neck. Denies fever, or any cold symptoms.  Does not interfere with eating or swallowing. If she coughs, she gets a pain that goes to her head. Overall had headache and took Nsaid and it went away. Appointment made today with the PA. Home care advice given to patient with verbal understanding.  Reason for Disposition . [1] Very tender to the touch AND [2] no fever  Answer Assessment - Initial Assessment Questions 1. LOCATION: "Where is the swollen node located?" "Is the matching node on the other side of the body also swollen?"      Both sides of neck 2. SIZE: "How big is the node?" (Inches or centimeters) (or compare to common objects such as pea, bean, marble, golf ball)      Not sure 3. ONSET: "When did the swelling start?"      4 days ago 4. NECK NODES: "Is there a sore throat, runny nose or other symptoms of a cold?"      no 5. GROIN OR ARMPIT NODES: "Is there a sore, scratch, cut or painful red area on that arm or leg?"      no 6. FEVER: "Do you have a fever?" If so, ask: "What is it, how was it measured, and when did it start?"      Feels feverish but does not have temp 7. CAUSE: "What do you think is causing the swollen lymph nodes?"     Not sure 8. OTHER SYMPTOMS: "Do you have any other symptoms?"     Pain in top of head when coughs, no congestion. Headache  9. PREGNANCY: "Is there any chance you are pregnant?" "When was your last menstrual period?"     No. LMP jan 12th  Protocols used: [place]

## 2017-04-19 ENCOUNTER — Ambulatory Visit: Payer: BLUE CROSS/BLUE SHIELD | Admitting: Family Medicine

## 2017-04-19 ENCOUNTER — Encounter: Payer: Self-pay | Admitting: Family Medicine

## 2017-04-19 ENCOUNTER — Other Ambulatory Visit: Payer: Self-pay

## 2017-04-19 VITALS — BP 122/80 | HR 115 | Temp 98.7°F | Resp 16 | Ht 67.0 in | Wt 225.4 lb

## 2017-04-19 DIAGNOSIS — B349 Viral infection, unspecified: Secondary | ICD-10-CM | POA: Diagnosis not present

## 2017-04-19 DIAGNOSIS — R509 Fever, unspecified: Secondary | ICD-10-CM | POA: Diagnosis not present

## 2017-04-19 LAB — CBC WITH DIFFERENTIAL/PLATELET
BASOS PCT: 1 % (ref 0.0–3.0)
Basophils Absolute: 0.1 10*3/uL (ref 0.0–0.1)
EOS PCT: 1.3 % (ref 0.0–5.0)
Eosinophils Absolute: 0.1 10*3/uL (ref 0.0–0.7)
HCT: 43.7 % (ref 36.0–46.0)
Hemoglobin: 14.9 g/dL (ref 12.0–15.0)
Lymphocytes Relative: 43.1 % (ref 12.0–46.0)
Lymphs Abs: 2.7 10*3/uL (ref 0.7–4.0)
MCHC: 34.1 g/dL (ref 30.0–36.0)
MCV: 88.1 fl (ref 78.0–100.0)
MONOS PCT: 12.7 % — AB (ref 3.0–12.0)
Monocytes Absolute: 0.8 10*3/uL (ref 0.1–1.0)
NEUTROS PCT: 41.9 % — AB (ref 43.0–77.0)
Neutro Abs: 2.7 10*3/uL (ref 1.4–7.7)
Platelets: 184 10*3/uL (ref 150.0–400.0)
RBC: 4.96 Mil/uL (ref 3.87–5.11)
RDW: 12.7 % (ref 11.5–15.5)
WBC: 6.3 10*3/uL (ref 4.0–10.5)

## 2017-04-19 LAB — POC INFLUENZA A&B (BINAX/QUICKVUE)
Influenza A, POC: NEGATIVE
Influenza B, POC: NEGATIVE

## 2017-04-19 NOTE — Patient Instructions (Signed)
Follow up as needed or as scheduled We'll notify you of your lab work- we are looking at your white blood cell count and checking for mono Continue to use tylenol and ibuprofen to keep your fever under control Drink plenty of fluids REST! Delsym as needed for cough Call with any questions or concerns Hang in there!!!

## 2017-04-19 NOTE — Progress Notes (Signed)
   Subjective:    Patient ID: Martha Cervantes, female    DOB: Aug 14, 1975, 42 y.o.   MRN: [id number]  HPI Fever- started 6 days ago, occurring daily starting in the afternoon.  Ranging from 100-102.  No HA, no sinus pressure, no nasal congestion.  + chest congestion, cough productive of sputum.  No ear pain.  No sore throat.  No known sick contacts.  No N/V/D.  + body aches- particularly when fever spikes.   Review of Systems For ROS see HPI     Objective:   Physical Exam  Constitutional: She is oriented to person, place, and time. She appears well-developed and well-nourished. No distress.  Obviously not feeling well  HENT:  Head: Normocephalic and atraumatic.  Right Ear: Tympanic membrane normal.  Left Ear: Tympanic membrane normal.  Nose: Mucosal edema and rhinorrhea present. Right sinus exhibits no maxillary sinus tenderness and no frontal sinus tenderness. Left sinus exhibits no maxillary sinus tenderness and no frontal sinus tenderness.  Mouth/Throat: Uvula is midline and mucous membranes are normal. Posterior oropharyngeal erythema present. No oropharyngeal exudate.  Eyes: Conjunctivae and EOM are normal. Pupils are equal, round, and reactive to light.  Neck: Normal range of motion. Neck supple.  Cardiovascular: Normal rate, regular rhythm and normal heart sounds.  Pulmonary/Chest: Effort normal and breath sounds normal. No respiratory distress. She has no wheezes.  No cough heard  Lymphadenopathy:    She has no cervical adenopathy.  Neurological: She is alert and oriented to person, place, and time.  Skin: Skin is warm.  Psychiatric: Her behavior is normal. Thought content normal.  Flat affect  Vitals reviewed.         Assessment & Plan:  Fever due to presumed viral illness- new.  No obvious source of infection on hx or PE.  Lungs CTA, no TTP over sinuses.  Flu test (-).  Reviewed dx and supportive care.  Also discussed red flags that should prompt return.  Pt expressed  understanding and is in agreement w/ plan.

## 2017-04-20 LAB — EPSTEIN-BARR VIRUS VCA ANTIBODY PANEL
EBV VCA IgG: <18 U/mL
EBV VCA IgM: <36 U/mL

## 2017-04-26 ENCOUNTER — Other Ambulatory Visit: Payer: Self-pay | Admitting: Family Medicine

## 2017-04-26 MED ORDER — LEVOTHYROXINE SODIUM 125 MCG PO TABS: 125.0000 ug | ORAL_TABLET | Freq: Every day | ORAL | 0 refills | Status: DC

## 2017-04-27 ENCOUNTER — Other Ambulatory Visit: Payer: Self-pay | Admitting: Family Medicine

## 2017-05-17 ENCOUNTER — Ambulatory Visit: Payer: BLUE CROSS/BLUE SHIELD | Admitting: Family Medicine

## 2017-05-17 ENCOUNTER — Encounter: Payer: Self-pay | Admitting: Family Medicine

## 2017-05-17 ENCOUNTER — Other Ambulatory Visit: Payer: Self-pay

## 2017-05-17 VITALS — BP 130/84 | HR 116 | Temp 99.0°F | Resp 17 | Ht 67.25 in | Wt 219.8 lb

## 2017-05-17 DIAGNOSIS — K529 Noninfective gastroenteritis and colitis, unspecified: Secondary | ICD-10-CM | POA: Diagnosis not present

## 2017-05-17 DIAGNOSIS — R5383 Other fatigue: Secondary | ICD-10-CM

## 2017-05-17 MED ORDER — ONDANSETRON HCL 4 MG PO TABS: 4.0000 mg | ORAL_TABLET | Freq: Three times a day (TID) | ORAL | 0 refills | Status: DC | PRN

## 2017-05-17 NOTE — Patient Instructions (Signed)
Please follow up if symptoms do not improve or as needed.    Viral Gastroenteritis, Adult Viral gastroenteritis is also known as the stomach flu. This condition is caused by certain germs (viruses). These germs can be passed from person to person very easily (are very contagious). This condition can cause sudden watery poop (diarrhea), fever, and throwing up (vomiting). Having watery poop and throwing up can make you feel weak and cause you to get dehydrated. Dehydration can make you tired and thirsty, make you have a dry mouth, and make it so you pee (urinate) less often. Older adults and people with other diseases or a weak defense system (immune system) are at higher risk for dehydration. It is important to replace the fluids that you lose from having watery poop and throwing up. Follow these instructions at home: Follow instructions from your doctor about how to care for yourself at home. Eating and drinking  Follow these instructions as told by your doctor:  Take an oral rehydration solution (ORS). This is a drink that is sold at pharmacies and stores.  Drink clear fluids in small amounts as you are able, such as: ? Water. ? Ice chips. ? Diluted fruit juice. ? Low-calorie sports drinks.  Eat bland, easy-to-digest foods in small amounts as you are able, such as: ? Bananas. ? Applesauce. ? Rice. ? Low-fat (lean) meats. ? Toast. ? Crackers.  Avoid fluids that have a lot of sugar or caffeine in them.  Avoid alcohol.  Avoid spicy or fatty foods.  General instructions  Drink enough fluid to keep your pee (urine) clear or pale yellow.  Wash your hands often. If you cannot use soap and water, use hand sanitizer.  Make sure that all people in your home wash their hands well and often.  Rest at home while you get better.  Take over-the-counter and prescription medicines only as told by your doctor.  Watch your condition for any changes.  Take a warm bath to help with any  burning or pain from having watery poop.  Keep all follow-up visits as told by your doctor. This is important. Contact a doctor if:  You cannot keep fluids down.  Your symptoms get worse.  You have new symptoms.  You feel light-headed or dizzy.  You have muscle cramps. Get help right away if:  You have chest pain.  You feel very weak or you pass out (faint).  You see blood in your throw-up.  Your throw-up looks like coffee grounds.  You have bloody or black poop (stools) or poop that look like tar.  You have a very bad headache, a stiff neck, or both.  You have a rash.  You have very bad pain, cramping, or bloating in your belly (abdomen).  You have trouble breathing.  You are breathing very quickly.  Your heart is beating very quickly.  Your skin feels cold and clammy.  You feel confused.  You have pain when you pee.  You have signs of dehydration, such as: ? Dark pee, hardly any pee, or no pee. ? Cracked lips. ? Dry mouth. ? Sunken eyes. ? Sleepiness. ? Weakness. This information is not intended to replace advice given to you by your health care provider. Make sure you discuss any questions you have with your health care provider. Document Released: 08/26/2007 Document Revised: 09/27/2015 Document Reviewed: 11/13/2014 Elsevier Interactive Patient Education  2017 [employer]   

## 2017-05-17 NOTE — Progress Notes (Signed)
Subjective:      Subjective  CC:  Chief Complaint  Patient presents with  . Emesis    vomitting, diarrhea, no fever    HPI: Martha Cervantes is a 42 y.o. female who presents for evaluation of diarrhea several times per day and nausea. Symptoms have been present for 3 days. Patient denies blood in stool, dysuria, fever, hematemesis, melena and mucoid stool. Patient's oral intake has been decreased for liquids. Patient's urine output has been adequate. No contacts with similar symptoms. Patient denies recent travel history. Patient has not had recent ingestion of possible contaminated food, toxic plants, or inappropriate medications/poisons. No recent use of antibiotics. She denies orthostatic symptoms.   She is also complaining of persistent fatigue for the last 4-6 weeks. Had a few visits in January, I reviewed the notes. Headaches and then viral syndrome with st. Lab results included a nl cbc and neg EBV panel at that time. She had improved somewhat from her flu like symptoms but never resolved her fatigue. Her kids have been sick in between as well.   I reviewed the patients updated PMH, FH, and SocHx.    Patient Active Problem List   Diagnosis Date Noted  . Physical exam 03/30/2016  . Hypothyroidism 09/27/2015  . Rosacea 09/27/2015  . Depression 09/27/2015  . Hx of migraine headaches 09/27/2015  . Obesity (BMI 30-39.9) 09/27/2015   Current Meds  Medication Sig  . cholecalciferol (VITAMIN D) 1000 units tablet Take 1,000 Units by mouth daily.  Marland Kitchen FLUoxetine (PROZAC) 10 MG tablet TAKE 1 TABLET BY MOUTH ONCE DAILY  . levothyroxine (SYNTHROID, LEVOTHROID) 125 MCG tablet Take 1 tablet (125 mcg total) by mouth daily before breakfast.    Allergies: Patient is allergic to amoxicillin. Family History: Patient family history includes Cancer in her maternal grandmother, sister, and sister; Depression in her mother; Diabetes in her maternal grandfather, maternal grandmother, paternal  grandfather, and paternal grandmother; Factor V Leiden deficiency in her father; Hyperlipidemia in her mother. Social History:  Patient  reports that  has never smoked. she has never used smokeless tobacco. She reports that she does not drink alcohol or use drugs.  Review of Systems: Constitutional: Negative for weight loss  Cardiovascular: negative for chest pain or palpitations Respiratory: negative for SOB or persistent cough Gastrointestinal: no severe pain, + cramping  Objective  Vitals: BP 130/84   Pulse (!) 116   Temp 99 F (37.2 C) (Oral)   Resp 17   Ht 5' 7.25" (1.708 m)   Wt 219 lb 12.8 oz (99.7 kg)   SpO2 96%   BMI 34.17 kg/m  General: no acute distress , A&Ox3, appears tired and frustrated HEENT: PEERL, conjunctiva normal, Oropharynx moist, neck is supple, + posterior cervical lymphadenopathy is present Cardiovascular:  RRR without murmur or gallop.  Respiratory:  Good breath sounds bilaterally, CTAB with normal respiratory effort Gastrointestinal: soft, increased bowel sounds, minimally tender w/o masses rebound or guarding.  Skin:  Warm, dry, no rashes Lab Results  Component Value Date   TSH 2.07 01/06/2017   Lab Results  Component Value Date   WBC 6.3 04/19/2017   HGB 14.9 04/19/2017   HCT 43.7 04/19/2017   MCV 88.1 04/19/2017   PLT 184.0 04/19/2017    Assessment  1. Gastroenteritis   2. Fatigue, unspecified type      Plan   Gastroenteritis:  Likely viral etiology given above hx and physical findings. Educated on prognosis and typical course of illness. Need to avoid dehydration. Ordered  medications to assist in that if needed and discussed pushing PO fluids. See AVS for GE information.   Push fluids. Clear liquid diet with increased fluids intake advancing ot BRATS diet and then regular as tolerated. Avoid dair products and caffeine until resolved.   Fatigue with posterior LAD concerning for mono. Initial panel was negative. However, sxs are  concerning for ebv or cmv or other. Counseling done. Recheck panel and support. F/u for other work up if needed.   Follow up: Return in 5-7 days if not resolved. Sooner if worsening.    Commons side effects, risks, benefits, and alternatives for medications and treatment plan prescribed today were discussed, and the patient expressed understanding of the given instructions. Patient is instructed to call or message via MyChart if he/she has any questions or concerns regarding our treatment plan. No barriers to understanding were identified. We discussed Red Flag symptoms and signs in detail. Patient expressed understanding regarding what to do in case of urgent or emergency type symptoms.   Medication list was reconciled, printed and provided to the patient in AVS. Patient instructions and summary information was reviewed with the patient as documented in the AVS. This note was prepared with assistance of Dragon voice recognition software. Occasional wrong-word or sound-a-like substitutions may have occurred due to the inherent limitations of voice recognition software  Orders Placed This Encounter  Procedures  . Epstein-Barr virus VCA antibody panel   Meds ordered this encounter  Medications  . ondansetron (ZOFRAN) 4 MG tablet    Sig: Take 1 tablet (4 mg total) by mouth every 8 (eight) hours as needed for nausea or vomiting.    Dispense:  20 tablet    Refill:  0

## 2017-05-18 LAB — EPSTEIN-BARR VIRUS VCA ANTIBODY PANEL
EBV NA IgG: <18 U/mL
EBV VCA IgG: <18 U/mL
EBV VCA IgM: 60.2 U/mL — ABNORMAL HIGH

## 2017-05-20 ENCOUNTER — Encounter: Payer: Self-pay | Admitting: Family Medicine

## 2017-05-25 ENCOUNTER — Encounter: Payer: Self-pay | Admitting: Family Medicine

## 2017-07-26 ENCOUNTER — Other Ambulatory Visit: Payer: Self-pay | Admitting: Family Medicine

## 2017-07-27 MED ORDER — LEVOTHYROXINE SODIUM 125 MCG PO TABS: 125.0000 ug | ORAL_TABLET | Freq: Every day | ORAL | 0 refills | Status: DC

## 2017-07-27 MED ORDER — FLUOXETINE HCL 10 MG PO TABS: 10.0000 mg | ORAL_TABLET | Freq: Every day | ORAL | 0 refills | Status: DC

## 2017-08-11 ENCOUNTER — Encounter: Payer: Self-pay | Admitting: Family Medicine

## 2017-10-12 ENCOUNTER — Encounter: Payer: Self-pay | Admitting: Obstetrics & Gynecology

## 2017-10-12 ENCOUNTER — Ambulatory Visit (INDEPENDENT_AMBULATORY_CARE_PROVIDER_SITE_OTHER): Payer: BLUE CROSS/BLUE SHIELD | Admitting: Obstetrics & Gynecology

## 2017-10-12 VITALS — BP 124/86 | Ht 66.25 in | Wt 230.0 lb

## 2017-10-12 DIAGNOSIS — Z1151 Encounter for screening for human papillomavirus (HPV): Secondary | ICD-10-CM | POA: Diagnosis not present

## 2017-10-12 DIAGNOSIS — Z9189 Other specified personal risk factors, not elsewhere classified: Secondary | ICD-10-CM | POA: Diagnosis not present

## 2017-10-12 DIAGNOSIS — Z01419 Encounter for gynecological examination (general) (routine) without abnormal findings: Secondary | ICD-10-CM | POA: Diagnosis not present

## 2017-10-12 NOTE — Progress Notes (Signed)
Martha Cervantes [birth year] [id number]   History:    42 y.o. G2P2L2 Married x 16 yrs.  Daughters 70 and 61 yo (Alma Friendly and Sheldon)  RP:  New patient presenting for annual gyn exam   HPI:  Menstrual periods normal every 24-28 days.  No BTB.  No pelvic pain.  No pain with IC.  Normal vaginal secretions.  Paps all normal in the past.  Urine/BMs wnl.  Breasts wnl.  Had Bilateral Dx Mammo neg 07/2016 for a ridge she felt on the left breast.  Health labs with Fam MD Dr Birdie Riddle.  H/O Depression controlled on Fluoxetine.  Strong Fam H/O Thyroid Ca, for that reason patient had a prophylactic Thyroidectomy, now on Levothyroxine.  Fraternal twin sister with H/O PCOS/Endometrial Ca.  No other 1st degree relative with Uterine, Ovarian, Breast, Prostate or Colon Cancer.  Patient is Factor V Leiden HTZ.  Past medical history,surgical history, family history and social history were all reviewed and documented in the EPIC chart.  Gynecologic History Patient's last menstrual period was 10/12/2017. Contraception: vasectomy Last Pap: 3 yrs ago. Results were: normal per patient (in [place]) Last mammogram: 07/2016. Results were: Dx bilateral mammo negative. Bone Density: Never Colonoscopy: Never  Obstetric History OB History  Gravida Para Term Preterm AB Living  2 2       2   SAB TAB Ectopic Multiple Live Births          2    # Outcome Date GA Lbr Len/2nd Weight Sex Delivery Anes PTL Lv  2 Para     F    LIV  1 Para     F Vag-Spont   LIV     ROS: A ROS was performed and pertinent positives and negatives are included in the history.  GENERAL: No fevers or chills. HEENT: No change in vision, no earache, sore throat or sinus congestion. NECK: No pain or stiffness. CARDIOVASCULAR: No chest pain or pressure. No palpitations. PULMONARY: No shortness of breath, cough or wheeze. GASTROINTESTINAL: No abdominal pain, nausea, vomiting or diarrhea, melena or bright red blood per rectum. GENITOURINARY: No urinary frequency,  urgency, hesitancy or dysuria. MUSCULOSKELETAL: No joint or muscle pain, no back pain, no recent trauma. DERMATOLOGIC: No rash, no itching, no lesions. ENDOCRINE: No polyuria, polydipsia, no heat or cold intolerance. No recent change in weight. HEMATOLOGICAL: No anemia or easy bruising or bleeding. NEUROLOGIC: No headache, seizures, numbness, tingling or weakness. PSYCHIATRIC: No depression, no loss of interest in normal activity or change in sleep pattern.     Exam:   BP 124/86   Ht 5' 6.25" (1.683 m)   Wt 230 lb (104.3 kg)   LMP 10/12/2017 Comment: Patient's husband with vasectomy  BMI 36.84 kg/m   Body mass index is 36.84 kg/m.  General appearance : Well developed well nourished female. No acute distress HEENT: Eyes: no retinal hemorrhage or exudates,  Neck supple, trachea midline, no carotid bruits, no thyroidmegaly Lungs: Clear to auscultation, no rhonchi or wheezes, or rib retractions  Heart: Regular rate and rhythm, no murmurs or gallops Breast:Examined in sitting and supine position were symmetrical in appearance, no palpable masses or tenderness,  no skin retraction, no nipple inversion, no nipple discharge, no skin discoloration, no axillary or supraclavicular lymphadenopathy Abdomen: no palpable masses or tenderness, no rebound or guarding Extremities: no edema or skin discoloration or tenderness  Pelvic: Vulva: Normal             Vagina: No gross lesions or discharge  Cervix: No gross lesions or discharge.  Pap/HPV HR done.  Uterus  AV, normal size, shape and consistency, non-tender and mobile  Adnexa  Without masses or tenderness  Anus: Normal   Assessment/Plan:  42 y.o. female for annual exam   1. Encounter for routine gynecological examination with Papanicolaou smear of cervix Normal gynecologic exam.  Pap with high-risk HPV done today.  Breast exam normal.  Will proceed with a screening mammogram this year when turns 42.  Health labs with Dr. Birdie Riddle family  physician.  2. Relies on partner vasectomy for contraception   Princess Bruins MD, 3:32 PM 10/12/2017

## 2017-10-12 NOTE — Patient Instructions (Signed)
1. Encounter for routine gynecological examination with Papanicolaou smear of cervix Normal gynecologic exam.  Pap with high-risk HPV done today.  Breast exam normal.  Will proceed with a screening mammogram this year when turns 42.  Health labs with Dr. Birdie Riddle family physician.  2. Relies on partner vasectomy for contraception  Martha Cervantes, it was a pleasure meeting you today!  I will inform you of your results as soon as they are available.

## 2017-10-14 LAB — PAP, TP IMAGING W/ HPV RNA, RFLX HPV TYPE 16,18/45: HPV DNA HIGH RISK: NOT DETECTED

## 2017-10-24 ENCOUNTER — Other Ambulatory Visit: Payer: Self-pay | Admitting: Family Medicine

## 2017-10-25 MED ORDER — FLUOXETINE HCL 10 MG PO TABS: 10.0000 mg | ORAL_TABLET | Freq: Every day | ORAL | 0 refills | Status: DC

## 2017-10-25 MED ORDER — LEVOTHYROXINE SODIUM 125 MCG PO TABS: 125.0000 ug | ORAL_TABLET | Freq: Every day | ORAL | 0 refills | Status: DC

## 2017-11-03 ENCOUNTER — Other Ambulatory Visit: Payer: Self-pay

## 2017-11-03 ENCOUNTER — Encounter: Payer: Self-pay | Admitting: Physician Assistant

## 2017-11-03 ENCOUNTER — Ambulatory Visit: Payer: BLUE CROSS/BLUE SHIELD | Admitting: Physician Assistant

## 2017-11-03 ENCOUNTER — Encounter: Payer: Self-pay | Admitting: Family Medicine

## 2017-11-03 VITALS — BP 110/78 | HR 92 | Temp 98.7°F | Ht 66.0 in | Wt 227.4 lb

## 2017-11-03 DIAGNOSIS — H6982 Other specified disorders of Eustachian tube, left ear: Secondary | ICD-10-CM | POA: Diagnosis not present

## 2017-11-03 MED ORDER — LORATADINE-PSEUDOEPHEDRINE ER 10-240 MG PO TB24: 1.0000 | ORAL_TABLET | Freq: Every day | ORAL | 0 refills | Status: DC

## 2017-11-03 MED ORDER — FLUTICASONE PROPIONATE 50 MCG/ACT NA SUSP: 2.0000 | Freq: Every day | NASAL | 0 refills | Status: DC

## 2017-11-03 NOTE — Patient Instructions (Signed)
Please keep well-hydrated. Start a saline nasal rinse each morning. Hold nose closed and blow through your nose to insufflate your ears.  After this, use the Flonase as directed.  Take the Claritin-D once daily as directed.  Follow-up if symptoms are not resolving.

## 2017-11-03 NOTE — Progress Notes (Signed)
Patient presents to clinic today c/o 3 days of left ear pressure with change in hearing and a sensation of fullness. Has just gotten over a small head cold. Denies fever, chills, true ear pain. Denies drainage from the ear. Denies tinnitus. Denies sinus pain, tooth pain. Denies recent travel or sick contact.   Past Medical History:  Diagnosis Date  . Depression   . Factor V Leiden ([place])   . Thyroid disease     Current Outpatient Medications on File Prior to Visit  Medication Sig Dispense Refill  . FLUoxetine (PROZAC) 10 MG tablet Take 1 tablet (10 mg total) by mouth daily. 90 tablet 0  . levothyroxine (SYNTHROID, LEVOTHROID) 125 MCG tablet Take 1 tablet (125 mcg total) by mouth daily before breakfast. 90 tablet 0   No current facility-administered medications on file prior to visit.     Allergies  Allergen Reactions  . Amoxicillin Rash    Family History  Problem Relation Age of Onset  . Hyperlipidemia Mother   . Depression Mother   . Factor V Leiden deficiency Father   . Cancer Sister        thyroid  . Uterine cancer Sister   . Diabetes Maternal Grandmother   . Cancer Maternal Grandmother        thyroid  . Diabetes Maternal Grandfather   . Diabetes Paternal Grandmother   . Diabetes Paternal Grandfather   . Cancer Sister        thyroid    Social History   Socioeconomic History  . Marital status: Married    Spouse name: Not on file  . Number of children: Not on file  . Years of education: Not on file  . Highest education level: Not on file  Occupational History  . Not on file  Social Needs  . Financial resource strain: Not on file  . Food insecurity:    Worry: Not on file    Inability: Not on file  . Transportation needs:    Medical: Not on file    Non-medical: Not on file  Tobacco Use  . Smoking status: Never Smoker  . Smokeless tobacco: Never Used  Substance and Sexual Activity  . Alcohol use: No    Comment: rare  . Drug use: No  . Sexual activity:  Yes    Partners: Male    Comment: 1st intercourse- 62, partners- 2, married- 78 yrs   Lifestyle  . Physical activity:    Days per week: Not on file    Minutes per session: Not on file  . Stress: Not on file  Relationships  . Social connections:    Talks on phone: Not on file    Gets together: Not on file    Attends religious service: Not on file    Active member of club or organization: Not on file    Attends meetings of clubs or organizations: Not on file    Relationship status: Not on file  Other Topics Concern  . Not on file  Social History Narrative  . Not on file    Review of Systems - See HPI.  All other ROS are negative.  BP 110/78   Pulse 92   Temp 98.7 F (37.1 C)   Ht 5\' 6"  (1.676 m)   Wt 227 lb 6.4 oz (103.1 kg)   LMP 10/12/2017 Comment: Patient's husband with vasectomy  SpO2 97%   BMI 36.70 kg/m   Physical Exam  Constitutional: She appears well-developed and well-nourished.  HENT:  Head: Normocephalic and atraumatic.  Nose: Nose normal.  Eyes: Conjunctivae are normal.  Neck: Neck supple.  Cardiovascular: Normal rate, regular rhythm, normal heart sounds and intact distal pulses.  Pulmonary/Chest: Effort normal and breath sounds normal. No stridor. No respiratory distress. She has no wheezes. She has no rales. She exhibits no tenderness.  Psychiatric: She has a normal mood and affect.  Vitals reviewed.  Recent Results (from the past 2160 hour(s))  PAP,TP IMGw/HPV RNA,rflx [id number],17/61     Status: None   Collection Time: 10/12/17  4:47 PM  Result Value Ref Range   Clinical Information:      Comment: None given   LMP:      Comment: 10/12/17   PREV. PAP:      Comment: NONE GIVEN   PREV. BX:      Comment: NONE GIVEN   HPV DNA Probe-Source      Comment: Cervix, Endocervix   STATEMENT OF ADEQUACY:      Comment: Satisfactory for evaluation. Endocervical/transformation zone component present. Partially obscuring blood    INTERPRETATION/RESULT:        Comment: Negative for intraepithelial lesion or malignancy.   Comment:      Comment: This Pap test has been evaluated with computer assisted technology.    CYTOTECHNOLOGIST:      Comment: JWW, CT(ASCP) CT screening location: [address], [address], [place], [place] [address]    HPV DNA High Risk Not Detected Not Detect    Comment: This test was performed using the APTIMA HPV Assay ([employer] . This assay detects E6/E7 viral messenger RNA (mRNA) from 14 high-risk HPV types (16,18,31,33,35,39,45,51,52,56,58,59,66,68). . The analytical performance characteristics of this assay have been determined by [facility]. The modifications have not been cleared or approved by the FDA. This assay has been validated pursuant to the CLIA regulations and is used for clinical purposes. EXPLANATORY NOTE:  . The Pap is a screening test for cervical cancer. It is  not a diagnostic test and is subject to false negative  and false positive results. It is most reliable when a  satisfactory sample, regularly obtained, is submitted  with relevant clinical findings and history, and when  the Pap result is evaluated along with historic and  current clinical information. .     Assessment/Plan: 1. Eustachian tube dysfunction, left Start Claritin-D and Flonase. Supportive measures reviewed. Follow-up if not resolving. May need oral steroid or ENT assessment.  - fluticasone (FLONASE) 50 MCG/ACT nasal spray; Place 2 sprays into both nostrils daily.  Dispense: 16 g; Refill: 0 - loratadine-pseudoephedrine (CLARITIN-D 24 HOUR) 10-240 MG 24 hr tablet; Take 1 tablet by mouth daily.  Dispense: 30 tablet; Refill: 0   Leeanne Rio, PA-C

## 2017-12-06 ENCOUNTER — Encounter: Payer: Self-pay | Admitting: Family Medicine

## 2017-12-31 ENCOUNTER — Other Ambulatory Visit: Payer: Self-pay

## 2017-12-31 ENCOUNTER — Ambulatory Visit: Payer: BLUE CROSS/BLUE SHIELD | Admitting: Family Medicine

## 2017-12-31 ENCOUNTER — Encounter: Payer: Self-pay | Admitting: Family Medicine

## 2017-12-31 VITALS — BP 102/80 | HR 74 | Temp 98.6°F | Resp 14 | Ht 66.0 in | Wt 231.0 lb

## 2017-12-31 DIAGNOSIS — Z23 Encounter for immunization: Secondary | ICD-10-CM | POA: Diagnosis not present

## 2017-12-31 DIAGNOSIS — H6982 Other specified disorders of Eustachian tube, left ear: Secondary | ICD-10-CM

## 2017-12-31 NOTE — Progress Notes (Signed)
   Subjective:    Patient ID: Martha Cervantes, female    DOB: 05-25-1975, 42 y.o.   MRN: [id number]  HPI L ear pain- pt got a cold in August and at that time she was dx'd w/ Eustachian tube dysfxn.  Continues to have ear fullness, decreased hearing, some popping.  No drainage.  No fevers.  No sxs on R.  Not using Flonase.   Review of Systems For ROS see HPI     Objective:   Physical Exam  Constitutional: She appears well-developed and well-nourished. No distress.  HENT:  Head: Normocephalic and atraumatic.  Right Ear: Tympanic membrane is retracted.  Left Ear: Tympanic membrane is retracted.  Nose: Mucosal edema and rhinorrhea present. Right sinus exhibits no maxillary sinus tenderness and no frontal sinus tenderness. Left sinus exhibits no maxillary sinus tenderness and no frontal sinus tenderness.  Mouth/Throat: Mucous membranes are normal. Posterior oropharyngeal erythema (w/ PND) present.  Eyes: Pupils are equal, round, and reactive to light. Conjunctivae and EOM are normal.  Neck: Normal range of motion. Neck supple.  Cardiovascular: Normal rate, regular rhythm and normal heart sounds.  Pulmonary/Chest: Effort normal and breath sounds normal. No respiratory distress. She has no wheezes. She has no rales.  Lymphadenopathy:    She has no cervical adenopathy.  Vitals reviewed.         Assessment & Plan:  Eustachian tube dysfxn- recurrent issue for pt.  Again reviewed dx and tx.  Pt instructed to restart antihistamine as well as nasal steroid.  Reviewed supportive care and red flags that should prompt return.  Pt expressed understanding and is in agreement w/ plan.

## 2017-12-31 NOTE — Patient Instructions (Signed)
Follow up as needed or as scheduled RESTART the Flonase twice daily Take an antihistamine as directed (Benadryl, Claritin, Zyrtec, etc) Drink plenty of fluids Call with any questions or concerns Hang in there!!!

## 2018-01-24 ENCOUNTER — Other Ambulatory Visit: Payer: Self-pay | Admitting: Family Medicine

## 2018-01-24 MED ORDER — FLUOXETINE HCL 10 MG PO TABS: 10.0000 mg | ORAL_TABLET | Freq: Every day | ORAL | 0 refills | Status: DC

## 2018-01-24 MED ORDER — LEVOTHYROXINE SODIUM 125 MCG PO TABS: 125.0000 ug | ORAL_TABLET | Freq: Every day | ORAL | 0 refills | Status: DC

## 2018-02-01 ENCOUNTER — Encounter: Payer: Self-pay | Admitting: Physician Assistant

## 2018-02-01 ENCOUNTER — Other Ambulatory Visit: Payer: Self-pay

## 2018-02-01 ENCOUNTER — Ambulatory Visit (INDEPENDENT_AMBULATORY_CARE_PROVIDER_SITE_OTHER): Payer: BLUE CROSS/BLUE SHIELD | Admitting: Physician Assistant

## 2018-02-01 VITALS — BP 98/70 | HR 101 | Temp 98.8°F | Resp 16 | Ht 66.0 in | Wt 228.0 lb

## 2018-02-01 DIAGNOSIS — R6889 Other general symptoms and signs: Secondary | ICD-10-CM

## 2018-02-01 LAB — POC INFLUENZA A&B (BINAX/QUICKVUE)
INFLUENZA A, POC: NEGATIVE
Influenza B, POC: NEGATIVE

## 2018-02-01 MED ORDER — BENZONATATE 100 MG PO CAPS: 100.0000 mg | ORAL_CAPSULE | Freq: Three times a day (TID) | ORAL | 0 refills | Status: DC | PRN

## 2018-02-01 MED ORDER — ALBUTEROL SULFATE HFA 108 (90 BASE) MCG/ACT IN AERS: 2.0000 | INHALATION_SPRAY | Freq: Four times a day (QID) | RESPIRATORY_TRACT | 0 refills | Status: DC | PRN

## 2018-02-01 NOTE — Progress Notes (Signed)
Patient presents to clinic today c/o 4 days of night-time fever with Tmax of 104 on first night of symptoms, chills, aches and nasal congestion. Notes symptoms are improving daily with no fever today thus far. Has noted some mild wheezing this AM and wanted assessment to make sure her lungs are clear. Denies recent travel or sick contact. Did receive her flu shot last month.  Past Medical History:  Diagnosis Date  . Depression   . Factor V Leiden ([place])   . Thyroid disease     Current Outpatient Medications on File Prior to Visit  Medication Sig Dispense Refill  . FLUoxetine (PROZAC) 10 MG tablet Take 1 tablet (10 mg total) by mouth daily. 90 tablet 0  . levothyroxine (SYNTHROID, LEVOTHROID) 125 MCG tablet Take 1 tablet (125 mcg total) by mouth daily before breakfast. 90 tablet 0   No current facility-administered medications on file prior to visit.     Allergies  Allergen Reactions  . Amoxicillin Rash    Family History  Problem Relation Age of Onset  . Hyperlipidemia Mother   . Depression Mother   . Factor V Leiden deficiency Father   . Cancer Sister        thyroid  . Uterine cancer Sister   . Diabetes Maternal Grandmother   . Cancer Maternal Grandmother        thyroid  . Diabetes Maternal Grandfather   . Diabetes Paternal Grandmother   . Diabetes Paternal Grandfather   . Cancer Sister        thyroid    Social History   Socioeconomic History  . Marital status: Married    Spouse name: Not on file  . Number of children: Not on file  . Years of education: Not on file  . Highest education level: Not on file  Occupational History  . Not on file  Social Needs  . Financial resource strain: Not on file  . Food insecurity:    Worry: Not on file    Inability: Not on file  . Transportation needs:    Medical: Not on file    Non-medical: Not on file  Tobacco Use  . Smoking status: Never Smoker  . Smokeless tobacco: Never Used  Substance and Sexual Activity  .  Alcohol use: No    Comment: rare  . Drug use: No  . Sexual activity: Yes    Partners: Male    Comment: 1st intercourse- 81, partners- 2, married- 70 yrs   Lifestyle  . Physical activity:    Days per week: Not on file    Minutes per session: Not on file  . Stress: Not on file  Relationships  . Social connections:    Talks on phone: Not on file    Gets together: Not on file    Attends religious service: Not on file    Active member of club or organization: Not on file    Attends meetings of clubs or organizations: Not on file    Relationship status: Not on file  Other Topics Concern  . Not on file  Social History Narrative  . Not on file   Review of Systems - See HPI.  All other ROS are negative.  BP 98/70   Pulse (!) 101   Temp 98.8 F (37.1 C) (Oral)   Resp 16   Ht 5\' 6"  (1.676 m)   Wt 228 lb (103.4 kg)   SpO2 96%   BMI 36.80 kg/m   Physical Exam  Constitutional: She is oriented to person, place, and time. She appears well-developed and well-nourished.  HENT:  Head: Normocephalic and atraumatic.  Right Ear: External ear normal.  Left Ear: External ear normal.  Nose: Nose normal.  Mouth/Throat: Oropharynx is clear and moist.  Eyes: Pupils are equal, round, and reactive to light. Conjunctivae are normal.  Neck: Neck supple.  Cardiovascular: Normal rate, regular rhythm, normal heart sounds and intact distal pulses.  Pulmonary/Chest: Effort normal. No stridor. No respiratory distress. She has wheezes. She has no rales. She exhibits no tenderness.  Lymphadenopathy:    She has no cervical adenopathy.  Neurological: She is alert and oriented to person, place, and time.  Psychiatric: She has a normal mood and affect.  Vitals reviewed.  Assessment/Plan: 1. Flu-like symptoms Flu swab negative but symptoms are consistent with a virus/flu-like illness. Symptoms improving on their own. She is outside window for Tamiflu. Will have her begin supportive measures and  antipyretics if needed. Rx Albuterol given for mild wheeze. Symptoms should continue to improve from this point forward. - POC Influenza A&B(BINAX/QUICKVUE)    Leeanne Rio, PA-C

## 2018-02-01 NOTE — Patient Instructions (Signed)
Please keep well-hydrated and get plenty of rest. Take Mucinex-DM for cough and congestion. Use the [employer] given for cough as well. Tylenol or Ibuprofen for aches. Use the albiterol inhaler as directed when needed for chest tightness/wheeze.  Since fevers have started improving on their own, this is a good sign that your body is getting a hold on things.   Upper Respiratory Infection, Adult Most upper respiratory infections (URIs) are caused by a virus. A URI affects the nose, throat, and upper air passages. The most common type of URI is often called "the common cold." Follow these instructions at home:  Take medicines only as told by your doctor.  Gargle warm saltwater or take cough drops to comfort your throat as told by your doctor.  Use a warm mist humidifier or inhale steam from a shower to increase air moisture. This may make it easier to breathe.  Drink enough fluid to keep your pee (urine) clear or pale yellow.  Eat soups and other clear broths.  Have a healthy diet.  Rest as needed.  Go back to work when your fever is gone or your doctor says it is okay. ? You may need to stay home longer to avoid giving your URI to others. ? You can also wear a face mask and wash your hands often to prevent spread of the virus.  Use your inhaler more if you have asthma.  Do not use any tobacco products, including cigarettes, chewing tobacco, or electronic cigarettes. If you need help quitting, ask your doctor. Contact a doctor if:  You are getting worse, not better.  Your symptoms are not helped by medicine.  You have chills.  You are getting more short of breath.  You have brown or red mucus.  You have yellow or brown discharge from your nose.  You have pain in your face, especially when you bend forward.  You have a fever.  You have puffy (swollen) neck glands.  You have pain while swallowing.  You have white areas in the back of your throat. Get help  right away if:  You have very bad or constant: ? Headache. ? Ear pain. ? Pain in your forehead, behind your eyes, and over your cheekbones (sinus pain). ? Chest pain.  You have long-lasting (chronic) lung disease and any of the following: ? Wheezing. ? Long-lasting cough. ? Coughing up blood. ? A change in your usual mucus.  You have a stiff neck.  You have changes in your: ? Vision. ? Hearing. ? Thinking. ? Mood. This information is not intended to replace advice given to you by your health care provider. Make sure you discuss any questions you have with your health care provider. Document Released: 08/26/2007 Document Revised: 11/10/2015 Document Reviewed: 06/14/2013 Elsevier Interactive Patient Education  2018 [employer].

## 2018-03-09 ENCOUNTER — Ambulatory Visit: Payer: BLUE CROSS/BLUE SHIELD | Admitting: Family Medicine

## 2018-03-11 ENCOUNTER — Ambulatory Visit: Payer: BLUE CROSS/BLUE SHIELD | Admitting: Family Medicine

## 2018-03-11 ENCOUNTER — Other Ambulatory Visit: Payer: Self-pay

## 2018-03-11 ENCOUNTER — Encounter: Payer: Self-pay | Admitting: Family Medicine

## 2018-03-11 VITALS — BP 124/84 | HR 80 | Temp 98.0°F | Resp 16 | Ht 66.0 in | Wt 228.2 lb

## 2018-03-11 DIAGNOSIS — F331 Major depressive disorder, recurrent, moderate: Secondary | ICD-10-CM

## 2018-03-11 DIAGNOSIS — R42 Dizziness and giddiness: Secondary | ICD-10-CM | POA: Diagnosis not present

## 2018-03-11 MED ORDER — MECLIZINE HCL 25 MG PO TABS: 25.0000 mg | ORAL_TABLET | Freq: Three times a day (TID) | ORAL | 0 refills | Status: DC | PRN

## 2018-03-11 MED ORDER — FLUOXETINE HCL 20 MG PO TABS: 20.0000 mg | ORAL_TABLET | Freq: Every day | ORAL | 3 refills | Status: DC

## 2018-03-11 NOTE — Assessment & Plan Note (Signed)
Deteriorated.  Pt is suffering from anxiety- particularly about her health.  Will increase Prozac to 20mg  daily and monitor for improvement.  Pt expressed understanding and is in agreement w/ plan.

## 2018-03-11 NOTE — Assessment & Plan Note (Signed)
New.  Reviewed dx and tx w/ pt.  Start Meclizine prn.  Increase fluid intake.  Change positions slowly.  Encouraged Modified Epley Maneuver.  Reviewed supportive care and red flags that should prompt return.  Pt expressed understanding and is in agreement w/ plan.

## 2018-03-11 NOTE — Progress Notes (Signed)
   Subjective:    Patient ID: Martha Cervantes, female    DOB: 1976-02-03, 42 y.o.   MRN: [id number]  HPI Vertigo- pt has been sick recently and developed vertigo.  sxs started Monday after sleeping on the couch.  'it felt like the world was spinning'.  sxs would resolve if pt moved slowly and held head still.  sxs have been improving daily.  Anxiety- pt reports this is worsening.  Gave up caffeine ~1 month ago.  Pt tends to panic any time she gets sick, 'I don't know why'.  Pt is currently on Fluoxetine 10mg  daily.  Pt feels depression is 'fine'.  Pt has some seasonal component to sxs.   Review of Systems For ROS see HPI     Objective:   Physical Exam Vitals signs reviewed.  Constitutional:      General: She is not in acute distress.    Appearance: She is well-developed.  HENT:     Head: Normocephalic and atraumatic.     Mouth/Throat:     Pharynx: Uvula midline.  Eyes:     Conjunctiva/sclera: Conjunctivae normal.     Pupils: Pupils are equal, round, and reactive to light.     Comments: 2-3 beats of horizontal nystagmus  Neck:     Musculoskeletal: Normal range of motion and neck supple.  Cardiovascular:     Rate and Rhythm: Normal rate and regular rhythm.     Heart sounds: Normal heart sounds.  Pulmonary:     Effort: Pulmonary effort is normal. No respiratory distress.     Breath sounds: Normal breath sounds. No wheezing or rales.  Lymphadenopathy:     Cervical: No cervical adenopathy.  Skin:    General: Skin is warm and dry.  Neurological:     Mental Status: She is alert and oriented to person, place, and time.     Cranial Nerves: No cranial nerve deficit.     Deep Tendon Reflexes: Reflexes are normal and symmetric.  Psychiatric:        Behavior: Behavior normal.        Thought Content: Thought content normal.        Judgment: Judgment normal.     Comments: anxious           Assessment & Plan:

## 2018-03-11 NOTE — Patient Instructions (Addendum)
Follow up as needed or as scheduled START the Meclizine as needed for the vertigo Drink plenty of fluids Change positions slowly Do the Epley Maneuver as directed to improve dizziness INCREASE the Fluoxetine to 20mg  daily to help w/ anxiety Call with any questions or concerns Happy Holidays!!!

## 2018-04-25 ENCOUNTER — Other Ambulatory Visit: Payer: Self-pay | Admitting: Family Medicine

## 2018-04-25 MED ORDER — LEVOTHYROXINE SODIUM 125 MCG PO TABS: 125.0000 ug | ORAL_TABLET | Freq: Every day | ORAL | 0 refills | Status: DC

## 2018-05-16 ENCOUNTER — Other Ambulatory Visit: Payer: Self-pay

## 2018-05-16 ENCOUNTER — Ambulatory Visit: Payer: BLUE CROSS/BLUE SHIELD | Admitting: Family Medicine

## 2018-05-16 ENCOUNTER — Encounter: Payer: Self-pay | Admitting: Family Medicine

## 2018-05-16 VITALS — BP 122/83 | HR 81 | Temp 98.1°F | Resp 16 | Ht 66.0 in | Wt 238.1 lb

## 2018-05-16 DIAGNOSIS — B9689 Other specified bacterial agents as the cause of diseases classified elsewhere: Secondary | ICD-10-CM | POA: Diagnosis not present

## 2018-05-16 DIAGNOSIS — J329 Chronic sinusitis, unspecified: Secondary | ICD-10-CM | POA: Diagnosis not present

## 2018-05-16 DIAGNOSIS — H66002 Acute suppurative otitis media without spontaneous rupture of ear drum, left ear: Secondary | ICD-10-CM

## 2018-05-16 MED ORDER — DOXYCYCLINE HYCLATE 100 MG PO TABS: 100.0000 mg | ORAL_TABLET | Freq: Two times a day (BID) | ORAL | 0 refills | Status: DC

## 2018-05-16 NOTE — Patient Instructions (Signed)
Schedule your complete physical at your convenience START the Doxycycline twice daily- take w/ food USE Ibuprofen as needed for headache and ear pain Drink plenty of fluids REST! RESTART a daily allergy medication- Claritin, Allegra Call with any questions or concerns Hang in there!!

## 2018-05-16 NOTE — Progress Notes (Addendum)
   Subjective:    Patient ID: Martha Cervantes, female    DOB: [birth year], 43 y.o.   MRN: [id number]  HPI URI- L ear pain.  Has had ear fullness 'for forever' but yesterday 'really started hurting'.  Pt is again coughing 'thick' sputum.  No fevers.  + L sided head pain/headache.  No recent sick contacts.     Review of Systems For ROS see HPI     Objective:   Physical Exam Vitals signs reviewed.  Constitutional:      Appearance: Normal appearance. She is obese.  HENT:     Head: Normocephalic and atraumatic.     Ears:     Comments: Bilateral EAC's red but no drainage or pain w/ manipulation of pinna R TM WNL L TM dull, bulging    Nose: Congestion present.     Comments: No TTP over frontal sinuses TTP over L maxillary sinus    Mouth/Throat:     Mouth: Mucous membranes are moist.     Pharynx: Oropharynx is clear. No oropharyngeal exudate or posterior oropharyngeal erythema.  Pulmonary:     Effort: Pulmonary effort is normal. No respiratory distress.     Breath sounds: Normal breath sounds. No wheezing or rhonchi.  Neurological:     General: No focal deficit present.     Mental Status: She is alert and oriented to person, place, and time.  Psychiatric:        Mood and Affect: Mood normal.        Behavior: Behavior normal.        Thought Content: Thought content normal.           Assessment & Plan:  L OM- new.  Pt's sxs and PE consistent w/ infxn.  Since pt is PCN allergic, will start Doxy.  Reviewed supportive care and red flags that should prompt return.  Pt expressed understanding and is in agreement w/ plan.   Bacterial sinusitis- new.  L maxillary.  Already treating OM and Doxy will cover bacterial sinusitis as well.  Pt expressed understanding and is in agreement w/ plan.

## 2018-05-17 ENCOUNTER — Encounter: Payer: Self-pay | Admitting: Family Medicine

## 2018-05-27 ENCOUNTER — Other Ambulatory Visit: Payer: Self-pay

## 2018-05-27 ENCOUNTER — Ambulatory Visit: Payer: BLUE CROSS/BLUE SHIELD | Admitting: Family Medicine

## 2018-05-27 ENCOUNTER — Encounter: Payer: Self-pay | Admitting: Family Medicine

## 2018-05-27 VITALS — BP 121/81 | HR 76 | Temp 99.1°F | Resp 17 | Ht 66.0 in | Wt 237.5 lb

## 2018-05-27 DIAGNOSIS — H66006 Acute suppurative otitis media without spontaneous rupture of ear drum, recurrent, bilateral: Secondary | ICD-10-CM

## 2018-05-27 DIAGNOSIS — H9192 Unspecified hearing loss, left ear: Secondary | ICD-10-CM

## 2018-05-27 MED ORDER — AZITHROMYCIN 250 MG PO TABS: ORAL_TABLET | ORAL | 0 refills | Status: DC

## 2018-05-27 NOTE — Progress Notes (Signed)
   Subjective:    Patient ID: Martha Cervantes, female    DOB: Sep 25, 1975, 43 y.o.   MRN: [id number]  HPI L OM- day after last appt pt woke up w/ fluid draining from ear.  Finished abx 2 days ago.  Ear still feels full and hearing is quite diminished.     Review of Systems For ROS see HPI     Objective:   Physical Exam Vitals signs reviewed.  Constitutional:      General: She is not in acute distress.    Appearance: Normal appearance. She is not toxic-appearing.  HENT:     Head: Normocephalic and atraumatic.     Right Ear: A middle ear effusion is present. Tympanic membrane is erythematous and bulging.     Left Ear: A middle ear effusion is present. Tympanic membrane is erythematous.  Neurological:     Mental Status: She is alert.           Assessment & Plan:  Bilateral OM w/ hearing loss on L- pt has hx of multiple ear surgeries and recurrent infxns.  Has known hearing loss but currently L is much worse.  Given presence of infection bilaterally, start abx.  Refer to ENT for complete evaluation and tx.  Pt expressed understanding and is in agreement w/ plan.

## 2018-05-27 NOTE — Patient Instructions (Signed)
Follow up as needed or as scheduled START the [employer]pack as directed We'll call you with your ENT appt Tylenol or ibuprofen as needed for pain/fever Call with any questions or concerns Hang in there!

## 2018-06-06 ENCOUNTER — Other Ambulatory Visit: Payer: Self-pay | Admitting: Family Medicine

## 2018-06-06 MED ORDER — LEVOTHYROXINE SODIUM 125 MCG PO TABS: 125.0000 ug | ORAL_TABLET | Freq: Every day | ORAL | 0 refills | Status: DC

## 2018-07-01 ENCOUNTER — Other Ambulatory Visit: Payer: Self-pay | Admitting: Family Medicine

## 2018-07-18 ENCOUNTER — Encounter: Payer: Self-pay | Admitting: Family Medicine

## 2018-08-02 DIAGNOSIS — H6983 Other specified disorders of Eustachian tube, bilateral: Secondary | ICD-10-CM | POA: Diagnosis not present

## 2018-08-02 DIAGNOSIS — H900 Conductive hearing loss, bilateral: Secondary | ICD-10-CM | POA: Diagnosis not present

## 2018-08-04 DIAGNOSIS — D224 Melanocytic nevi of scalp and neck: Secondary | ICD-10-CM | POA: Diagnosis not present

## 2018-08-04 DIAGNOSIS — D225 Melanocytic nevi of trunk: Secondary | ICD-10-CM | POA: Diagnosis not present

## 2018-08-04 DIAGNOSIS — L814 Other melanin hyperpigmentation: Secondary | ICD-10-CM | POA: Diagnosis not present

## 2018-08-04 DIAGNOSIS — L719 Rosacea, unspecified: Secondary | ICD-10-CM | POA: Diagnosis not present

## 2018-08-10 ENCOUNTER — Other Ambulatory Visit: Payer: Self-pay | Admitting: Family Medicine

## 2018-08-10 MED ORDER — FLUOXETINE HCL 20 MG PO TABS: 20.0000 mg | ORAL_TABLET | Freq: Every day | ORAL | 0 refills | Status: DC

## 2018-08-29 ENCOUNTER — Encounter: Payer: Self-pay | Admitting: Family Medicine

## 2018-09-11 ENCOUNTER — Other Ambulatory Visit: Payer: Self-pay | Admitting: Family Medicine

## 2018-09-12 MED ORDER — FLUOXETINE HCL 20 MG PO TABS: 20.0000 mg | ORAL_TABLET | Freq: Every day | ORAL | 6 refills | Status: DC

## 2018-09-15 ENCOUNTER — Encounter: Payer: Self-pay | Admitting: Family Medicine

## 2018-09-15 ENCOUNTER — Ambulatory Visit: Payer: Commercial Managed Care - PPO | Admitting: Family Medicine

## 2018-09-15 ENCOUNTER — Other Ambulatory Visit: Payer: Self-pay

## 2018-09-15 VITALS — BP 121/82 | HR 98 | Temp 97.9°F | Resp 16 | Ht 66.0 in | Wt 237.5 lb

## 2018-09-15 DIAGNOSIS — R1031 Right lower quadrant pain: Secondary | ICD-10-CM | POA: Diagnosis not present

## 2018-09-15 DIAGNOSIS — R590 Localized enlarged lymph nodes: Secondary | ICD-10-CM | POA: Diagnosis not present

## 2018-09-15 NOTE — Patient Instructions (Signed)
Follow up as needed or as scheduled The area under the armpit appears to be a reactive lymph node- likely in response to the bite Continue to monitor the area for change in size, shape, tenderness, etc and let me know if there are changes Try and keep a log of the abdomina/groin pain so we can figure out if there are particular triggers or associations.  Note when it occurs, what you were doing, how long it lasts, what makes it better or worse Call with any questions or concerns Hang in there! Stay Safe!

## 2018-09-15 NOTE — Progress Notes (Signed)
   Subjective:    Patient ID: Martha Cervantes, female    DOB: [birth year], 43 y.o.   MRN: [id number]  HPI Lump under arm-  L side.  First noticed ~2 weeks ago.  Not painful.  No redness or drainage.  Just 1- none under R arm.  Pt had tick bite on L upper arm.  abd pain- pt reports 'an ache' in the RLQ 'for a couple of months'.  Intermittent.  No particular trigger, nothing relieves or worsens.  Occasionally worse w/ movement.  No N/V/D.  No constipation.  No recent change in activity.  Pt reports sxs worsen around her period   Review of Systems For ROS see HPI     Objective:   Physical Exam Vitals signs reviewed.  Constitutional:      General: She is not in acute distress.    Appearance: She is well-developed. She is obese. She is not ill-appearing.  HENT:     Head: Normocephalic and atraumatic.  Abdominal:     General: There is no distension.     Palpations: There is no mass.     Tenderness: There is no abdominal tenderness. There is no guarding or rebound.     Hernia: No hernia is present.  Lymphadenopathy:     Cervical: No cervical adenopathy.     Upper Body:     Right upper body: No supraclavicular, axillary or pectoral adenopathy.     Left upper body: Axillary adenopathy (1 cm freely mobile, non tender) present. No supraclavicular or pectoral adenopathy.     Lower Body: No right inguinal adenopathy. No left inguinal adenopathy.  Neurological:     Mental Status: She is alert.           Assessment & Plan:  Axillary LAD- new.  L sided.  Appears to be reactive and resolving as area is small, freely mobile, and non tender.  No redness or induration.  No other LAD.  Encouraged pt to monitor  R groin pain- new.  No abnormalities on exam today.  No TTP.  No masses or hernias felt.  Discussed that this could be a hernia but we would need imaging to assess as it is not palpable on exam.  Given that it seems to be more sensitive w/ menstrual cycle she could have an ovarian cyst.   We discussed imaging but pt would like to see GYN next month but will let me know if things worsen.

## 2018-09-27 ENCOUNTER — Encounter: Payer: Self-pay | Admitting: Family Medicine

## 2018-10-10 ENCOUNTER — Other Ambulatory Visit: Payer: Self-pay | Admitting: Family Medicine

## 2018-10-10 MED ORDER — LEVOTHYROXINE SODIUM 125 MCG PO TABS: 125.0000 ug | ORAL_TABLET | Freq: Every day | ORAL | 0 refills | Status: DC

## 2018-10-13 ENCOUNTER — Other Ambulatory Visit: Payer: Self-pay

## 2018-10-14 ENCOUNTER — Ambulatory Visit (INDEPENDENT_AMBULATORY_CARE_PROVIDER_SITE_OTHER): Payer: Commercial Managed Care - PPO | Admitting: Obstetrics & Gynecology

## 2018-10-14 ENCOUNTER — Encounter: Payer: Self-pay | Admitting: Obstetrics & Gynecology

## 2018-10-14 VITALS — BP 130/88 | Ht 66.25 in | Wt 240.0 lb

## 2018-10-14 DIAGNOSIS — Z6838 Body mass index (BMI) 38.0-38.9, adult: Secondary | ICD-10-CM | POA: Diagnosis not present

## 2018-10-14 DIAGNOSIS — Z01419 Encounter for gynecological examination (general) (routine) without abnormal findings: Secondary | ICD-10-CM

## 2018-10-14 DIAGNOSIS — Z9189 Other specified personal risk factors, not elsewhere classified: Secondary | ICD-10-CM | POA: Diagnosis not present

## 2018-10-14 DIAGNOSIS — E6609 Other obesity due to excess calories: Secondary | ICD-10-CM

## 2018-10-14 NOTE — Patient Instructions (Signed)
1. Well female exam with routine gynecological exam Normal gynecologic exam.  Pap test in July 2019 was negative with negative high-risk HPV, no indication to repeat this year.  Breast exam normal.  Will schedule a screening mammogram at the breast center now.  Health labs with Dr. Birdie Riddle.  2. Relies on partner vasectomy for contraception  3. Class 2 obesity due to excess calories without serious comorbidity with body mass index (BMI) of 38.0 to 38.9 in adult Recommend a lower calorie/carb diet such as [employer].  Aerobic physical activities 5 times a week and weightlifting every 2 days.  Martha Cervantes, it was a pleasure seeing you today!

## 2018-10-14 NOTE — Progress Notes (Signed)
Martha Cervantes 1975/12/31 [id number]   History:    43 y.o. G2P2L2 Married. Vasectomy.  Daughters 53 and 18 yo.  RP:  Established patient presenting for annual gyn exam   HPI: Normal menstrual periods every month with normal flow.  No breakthrough bleeding.  No pelvic pain.  No pain with intercourse.  Urine and bowel movements normal.  Breasts normal.  Body mass index 38.45.  Not exercising regularly currently.  Health labs with Dr. Birdie Riddle.  Past medical history,surgical history, family history and social history were all reviewed and documented in the EPIC chart.  Gynecologic History Patient's last menstrual period was 10/01/2018. Contraception: vasectomy Last Pap: 09/2017. Results were: Negative/HPV HR neg Last mammogram: 07/2016. Results were: Negative Bone Density: Never Colonoscopy: Never  Obstetric History OB History  Gravida Para Term Preterm AB Living  2 2       2   SAB TAB Ectopic Multiple Live Births          2    # Outcome Date GA Lbr Len/2nd Weight Sex Delivery Anes PTL Lv  2 Para     F    LIV  1 Para     F Vag-Spont   LIV     ROS: A ROS was performed and pertinent positives and negatives are included in the history.  GENERAL: No fevers or chills. HEENT: No change in vision, no earache, sore throat or sinus congestion. NECK: No pain or stiffness. CARDIOVASCULAR: No chest pain or pressure. No palpitations. PULMONARY: No shortness of breath, cough or wheeze. GASTROINTESTINAL: No abdominal pain, nausea, vomiting or diarrhea, melena or bright red blood per rectum. GENITOURINARY: No urinary frequency, urgency, hesitancy or dysuria. MUSCULOSKELETAL: No joint or muscle pain, no back pain, no recent trauma. DERMATOLOGIC: No rash, no itching, no lesions. ENDOCRINE: No polyuria, polydipsia, no heat or cold intolerance. No recent change in weight. HEMATOLOGICAL: No anemia or easy bruising or bleeding. NEUROLOGIC: No headache, seizures, numbness, tingling or weakness. PSYCHIATRIC: No  depression, no loss of interest in normal activity or change in sleep pattern.     Exam:   BP 130/88    Ht 5' 6.25" (1.683 m)    Wt 240 lb (108.9 kg)    LMP 10/01/2018 Comment: pts husband with vasectomy   BMI 38.45 kg/m   Body mass index is 38.45 kg/m.  General appearance : Well developed well nourished female. No acute distress HEENT: Eyes: no retinal hemorrhage or exudates,  Neck supple, trachea midline, no carotid bruits, no thyroidmegaly Lungs: Clear to auscultation, no rhonchi or wheezes, or rib retractions  Heart: Regular rate and rhythm, no murmurs or gallops Breast:Examined in sitting and supine position were symmetrical in appearance, no palpable masses or tenderness,  no skin retraction, no nipple inversion, no nipple discharge, no skin discoloration, no axillary or supraclavicular lymphadenopathy Abdomen: no palpable masses or tenderness, no rebound or guarding Extremities: no edema or skin discoloration or tenderness  Pelvic: Vulva: Normal             Vagina: No gross lesions or discharge  Cervix: No gross lesions or discharge  Uterus  AV, normal size, shape and consistency, non-tender and mobile  Adnexa  Without masses or tenderness  Anus: Normal   Assessment/Plan:  43 y.o. female for annual exam   1. Well female exam with routine gynecological exam Normal gynecologic exam.  Pap test in July 2019 was negative with negative high-risk HPV, no indication to repeat this year.  Breast exam normal.  Will schedule a screening mammogram at the breast center now.  Health labs with Dr. Birdie Riddle.  2. Relies on partner vasectomy for contraception  3. Class 2 obesity due to excess calories without serious comorbidity with body mass index (BMI) of 38.0 to 38.9 in adult Recommend a lower calorie/carb diet such as [employer].  Aerobic physical activities 5 times a week and weightlifting every 2 days.  Princess Bruins MD, 2:15 PM 10/14/2018

## 2019-01-09 ENCOUNTER — Other Ambulatory Visit: Payer: Self-pay | Admitting: Family Medicine

## 2019-01-16 ENCOUNTER — Encounter: Payer: Self-pay | Admitting: Family Medicine

## 2019-01-18 ENCOUNTER — Encounter: Payer: Self-pay | Admitting: Family Medicine

## 2019-01-18 ENCOUNTER — Other Ambulatory Visit: Payer: Self-pay

## 2019-01-18 DIAGNOSIS — Z20822 Contact with and (suspected) exposure to covid-19: Secondary | ICD-10-CM

## 2019-01-19 LAB — NOVEL CORONAVIRUS, NAA: SARS-CoV-2, NAA: NOT DETECTED

## 2019-01-22 ENCOUNTER — Encounter: Payer: Self-pay | Admitting: Family Medicine

## 2019-01-23 ENCOUNTER — Encounter: Payer: Self-pay | Admitting: Family Medicine

## 2019-02-01 IMAGING — MG 2D DIGITAL DIAGNOSTIC BILATERAL MAMMOGRAM WITH CAD AND ADJUNCT T
8 of 18 series · 8 of 40 positions shown · non-contrast
Comparison: Patient's prior mammograms are not available at the
time of this report.

CLINICAL DATA: Bilateral breast pain and lumpiness. Two areas of
palpable concern in the left breast felt clinically.

EXAM:
2D DIGITAL DIAGNOSTIC BILATERAL MAMMOGRAM WITH CAD AND ADJUNCT TOMO
ULTRASOUND LEFT BREAST

[L MLO]
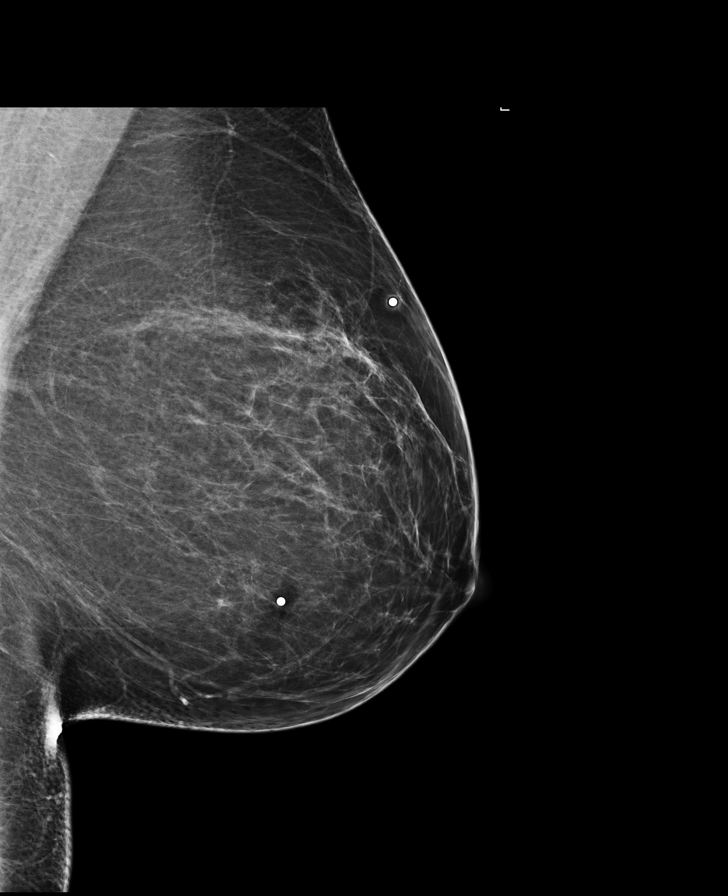

[L TAN (1 of 2)]
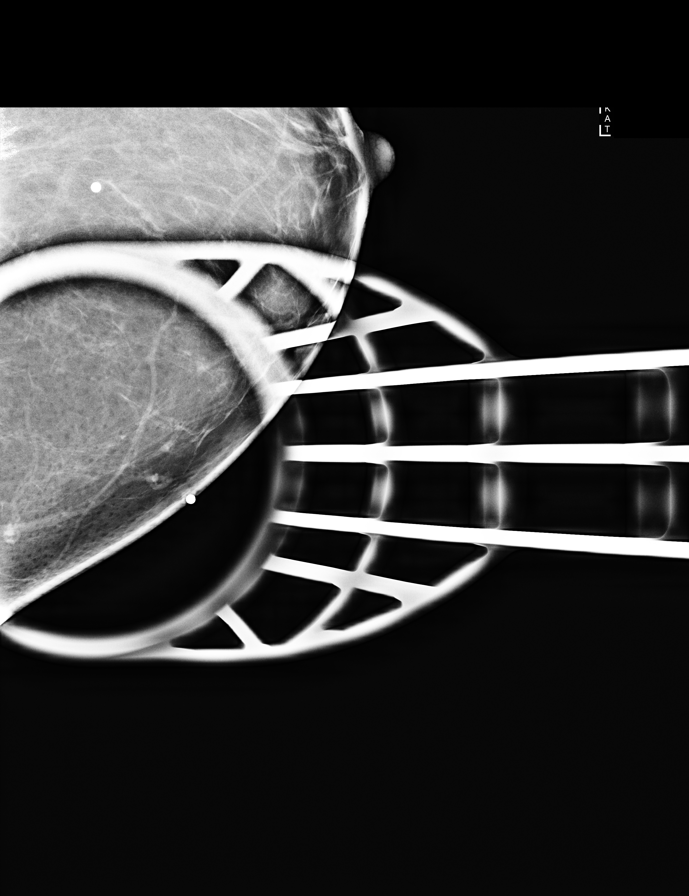

[L MLO synth-2D]
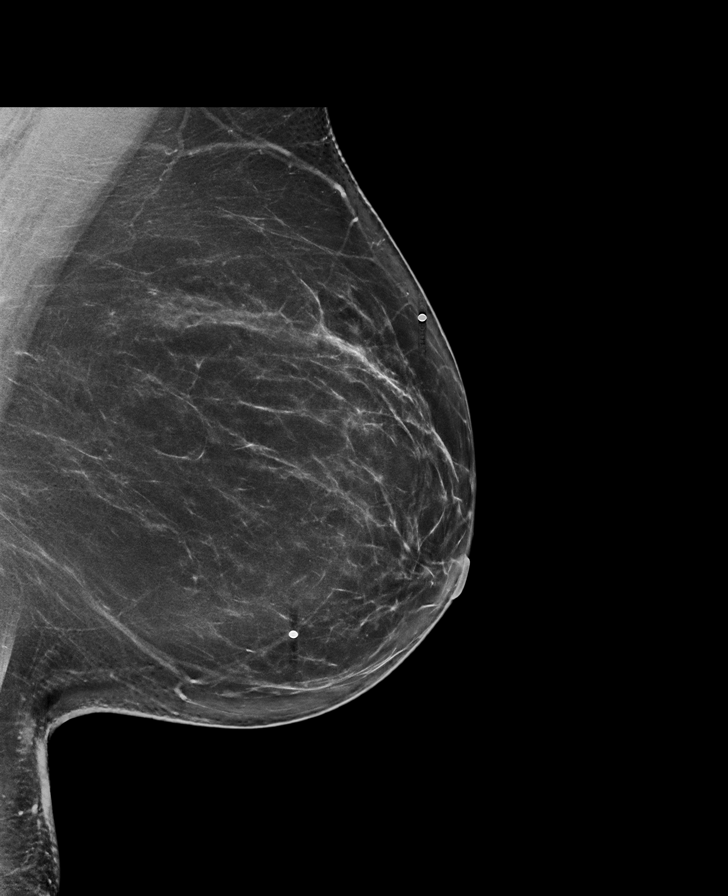

[R CC]
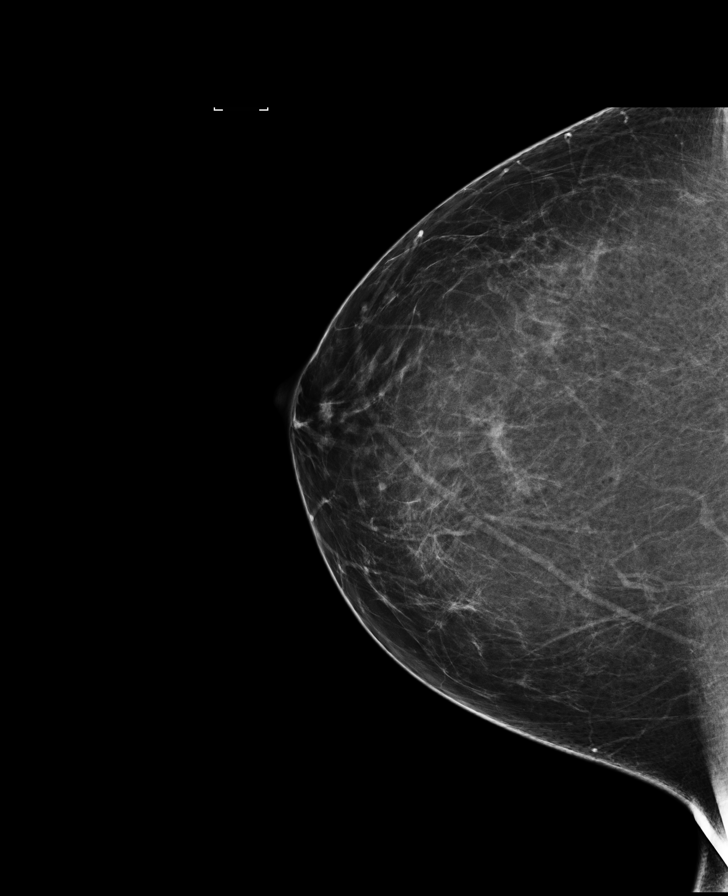

[L TAN (2 of 2)]
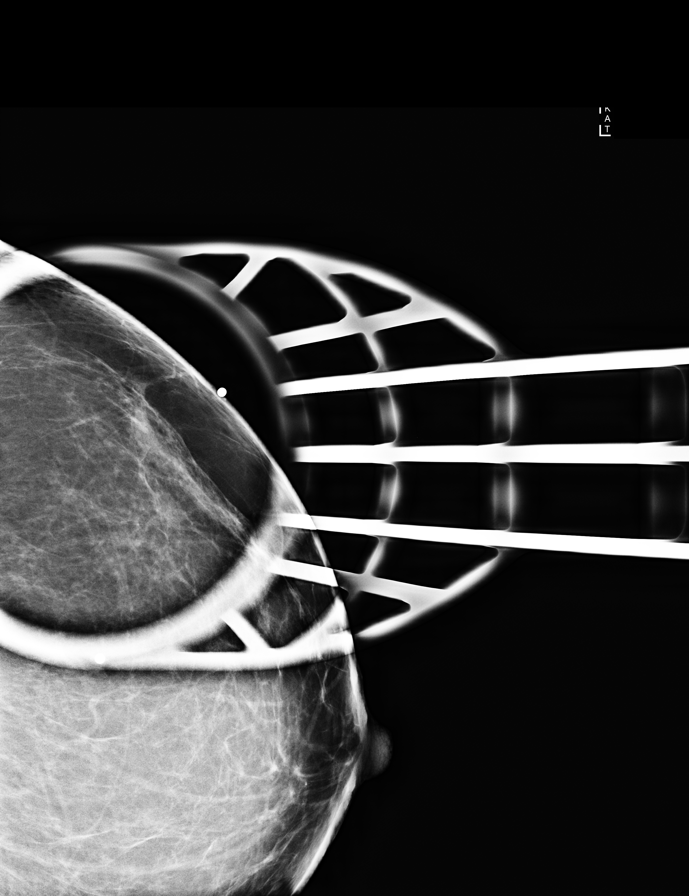

[R CC synth-2D]
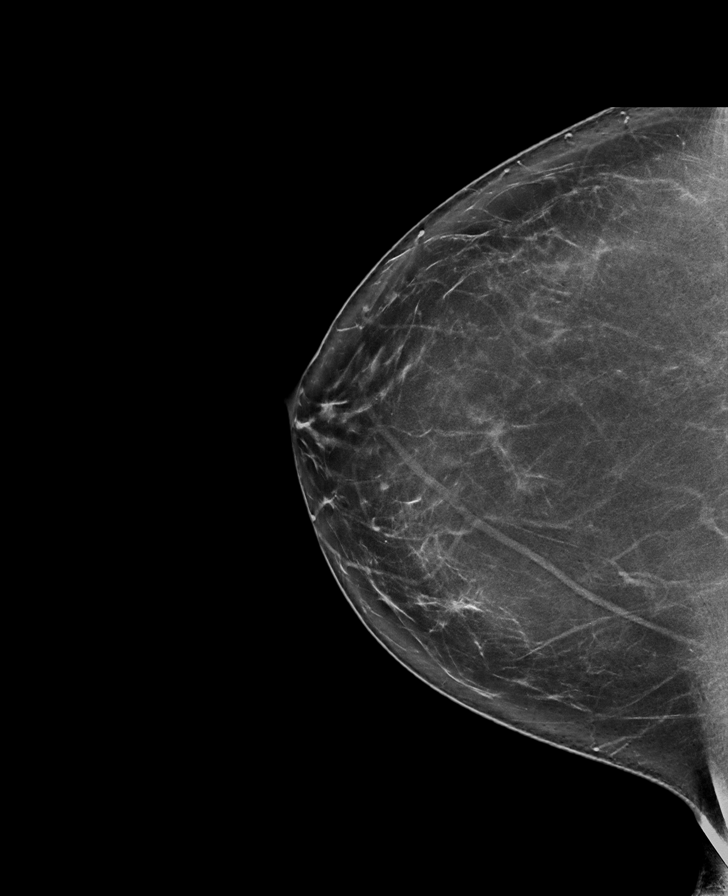

[L CC]
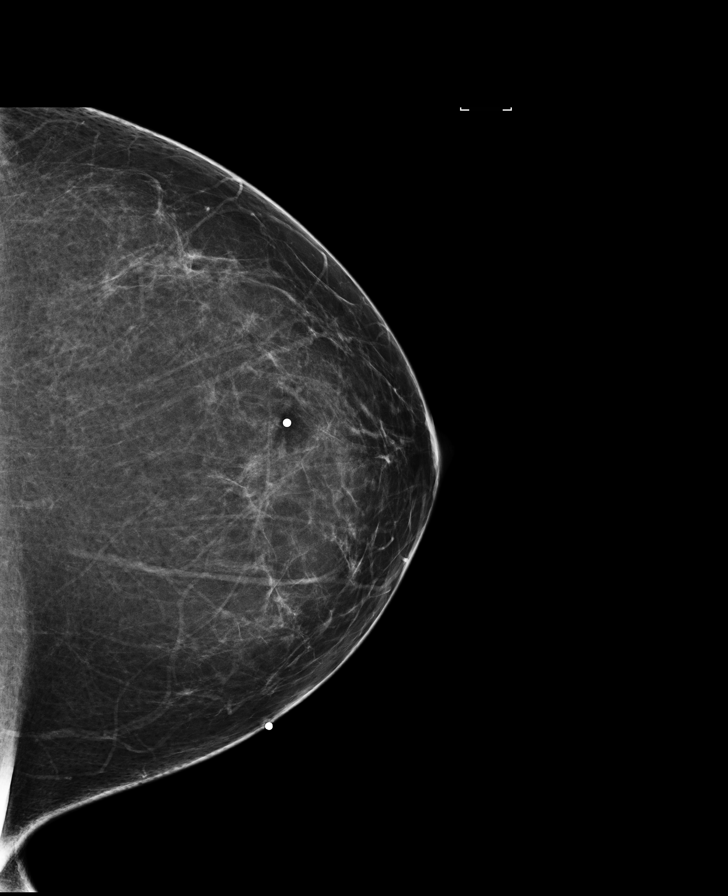

[R MLO synth-2D]
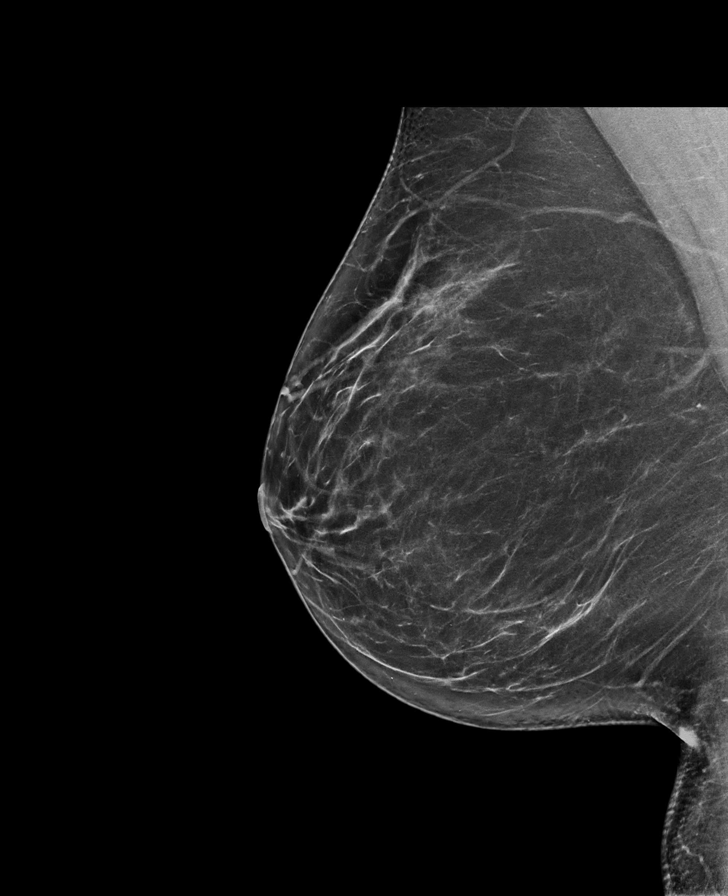

[8 of 40 positions shown; findings below may reference images not displayed]

ACR Breast Density Category b: There are scattered areas of
fibroglandular density.
FINDINGS: Mammographically, there are no suspicious masses, areas of
architectural distortion or microcalcifications in either breast.

Mammographic images were processed with CAD.

On physical exam, no suspicious masses are palpated.

Targeted ultrasound is performed, showing no suspicious masses or
shadowing lesions.
IMPRESSION: No mammographic sonographic evidence of malignancy in either breast.

Further management of patient's area of palpable concern in the left
breast should be based on clinical grounds.

RECOMMENDATION:
Otherwise, screening mammograms recommended in 1 year.

I have discussed the findings and recommendations with the patient.
Results were also provided in writing at the conclusion of the
visit. If applicable, a reminder letter will be sent to the patient
regarding the next appointment.

BI-RADS CATEGORY  1: Negative.

## 2019-03-28 ENCOUNTER — Encounter: Payer: Self-pay | Admitting: Family Medicine

## 2019-04-06 ENCOUNTER — Encounter: Payer: Self-pay | Admitting: Family Medicine

## 2019-04-07 ENCOUNTER — Other Ambulatory Visit: Payer: Self-pay

## 2019-04-07 ENCOUNTER — Encounter: Payer: Self-pay | Admitting: Family Medicine

## 2019-04-10 ENCOUNTER — Ambulatory Visit (INDEPENDENT_AMBULATORY_CARE_PROVIDER_SITE_OTHER): Payer: Commercial Managed Care - PPO

## 2019-04-10 ENCOUNTER — Other Ambulatory Visit: Payer: Self-pay

## 2019-04-10 ENCOUNTER — Ambulatory Visit (INDEPENDENT_AMBULATORY_CARE_PROVIDER_SITE_OTHER): Payer: Commercial Managed Care - PPO | Admitting: Physician Assistant

## 2019-04-10 ENCOUNTER — Encounter: Payer: Self-pay | Admitting: Physician Assistant

## 2019-04-10 ENCOUNTER — Telehealth: Payer: Self-pay | Admitting: Family Medicine

## 2019-04-10 VITALS — BP 118/76 | HR 79 | Temp 98.5°F | Resp 15

## 2019-04-10 DIAGNOSIS — E890 Postprocedural hypothyroidism: Secondary | ICD-10-CM | POA: Diagnosis not present

## 2019-04-10 DIAGNOSIS — Z23 Encounter for immunization: Secondary | ICD-10-CM

## 2019-04-10 DIAGNOSIS — Z111 Encounter for screening for respiratory tuberculosis: Secondary | ICD-10-CM

## 2019-04-10 DIAGNOSIS — E669 Obesity, unspecified: Secondary | ICD-10-CM

## 2019-04-10 DIAGNOSIS — F331 Major depressive disorder, recurrent, moderate: Secondary | ICD-10-CM

## 2019-04-10 DIAGNOSIS — Z0000 Encounter for general adult medical examination without abnormal findings: Secondary | ICD-10-CM

## 2019-04-10 DIAGNOSIS — E559 Vitamin D deficiency, unspecified: Secondary | ICD-10-CM

## 2019-04-10 LAB — CBC WITH DIFFERENTIAL/PLATELET
Basophils Absolute: 0 10*3/uL (ref 0.0–0.1)
Basophils Relative: 0.5 % (ref 0.0–3.0)
Eosinophils Absolute: 0.1 10*3/uL (ref 0.0–0.7)
Eosinophils Relative: 1.1 % (ref 0.0–5.0)
HCT: 40.3 % (ref 36.0–46.0)
Hemoglobin: 13.5 g/dL (ref 12.0–15.0)
Lymphocytes Relative: 34.1 % (ref 12.0–46.0)
Lymphs Abs: 2.2 10*3/uL (ref 0.7–4.0)
MCHC: 33.6 g/dL (ref 30.0–36.0)
MCV: 89.8 fl (ref 78.0–100.0)
Monocytes Absolute: 0.5 10*3/uL (ref 0.1–1.0)
Monocytes Relative: 7.7 % (ref 3.0–12.0)
Neutro Abs: 3.6 10*3/uL (ref 1.4–7.7)
Neutrophils Relative %: 56.6 % (ref 43.0–77.0)
Platelets: 258 10*3/uL (ref 150.0–400.0)
RBC: 4.48 Mil/uL (ref 3.87–5.11)
RDW: 13.2 % (ref 11.5–15.5)
WBC: 6.4 10*3/uL (ref 4.0–10.5)

## 2019-04-10 LAB — COMPREHENSIVE METABOLIC PANEL
ALT: 37 U/L — ABNORMAL HIGH (ref 0–35)
AST: 24 U/L (ref 0–37)
Albumin: 4 g/dL (ref 3.5–5.2)
Alkaline Phosphatase: 76 U/L (ref 39–117)
BUN: 13 mg/dL (ref 6–23)
CO2: 26 mEq/L (ref 19–32)
Calcium: 9 mg/dL (ref 8.4–10.5)
Chloride: 102 mEq/L (ref 96–112)
Creatinine, Ser: 0.78 mg/dL (ref 0.40–1.20)
GFR: 81.22 mL/min (ref >60.00)
Glucose, Bld: 168 mg/dL — ABNORMAL HIGH (ref 70–99)
Potassium: 4 mEq/L (ref 3.5–5.1)
Sodium: 136 mEq/L (ref 135–145)
Total Bilirubin: 0.5 mg/dL (ref 0.2–1.2)
Total Protein: 6.2 g/dL (ref 6.0–8.3)

## 2019-04-10 LAB — HEMOGLOBIN A1C: Hgb A1c MFr Bld: 5.5 % (ref 4.6–6.5)

## 2019-04-10 LAB — LIPID PANEL
Cholesterol: 185 mg/dL (ref 0–200)
HDL: 44.4 mg/dL (ref >39.00)
LDL Cholesterol: 108 mg/dL — ABNORMAL HIGH (ref 0–99)
NonHDL: 140.81
Total CHOL/HDL Ratio: 4
Triglycerides: 166 mg/dL — ABNORMAL HIGH (ref 0.0–149.0)
VLDL: 33.2 mg/dL (ref 0.0–40.0)

## 2019-04-10 LAB — TSH: TSH: 1.88 u[IU]/mL (ref 0.35–4.50)

## 2019-04-10 LAB — VITAMIN D 25 HYDROXY (VIT D DEFICIENCY, FRACTURES): VITD: 24.69 ng/mL — ABNORMAL LOW (ref 30.00–100.00)

## 2019-04-10 NOTE — Telephone Encounter (Signed)
I have placed a health exam cert. In the bin up front with a charge sheet.

## 2019-04-10 NOTE — Progress Notes (Addendum)
I connected with patient on 04/17/19 at  8:30 AM EST by a video enabled telemedicine application and verified that I am speaking with the correct person using two identifiers.  Location patient: Home Location provider: [facility], Office Persons participating in the virtual visit: Patient, Provider, [place] (Patina Moore)  I discussed the limitations of evaluation and management by telemedicine and the availability of in person appointments. The patient expressed understanding and agreed to proceed. Patient presents to clinic today for annual exam.  Patient is fasting for labs.  Diet -- Notes could be improved presently.  Exercise -- Is staying active around the house but no regular exercise regimen at present.   Acute Concerns: Denies acute concerns at today's visit.  Chronic Issues: Hypothyroidism --patient currently on a regimen of levothyroxine 125 mcg daily.  Endorses taking medication as directed and tolerating well.  Denies change in bowel habits, dry skin, fatigue or hair loss.  Is due for repeat TSH.  Depression --patient is currently on a regimen of fluoxetine 20 mg daily.  Endorses taking as directed and tolerating well.  Notes this helps significantly with her mood.  Denies breakthrough symptoms.  Is sleeping and eating well.  Denies suicidal thought or ideation.  Health Maintenance: Immunizations -- TDaP UTD, Agrees to flu shot  PAP -- UTD  Past Medical History:  Diagnosis Date  . Depression   . Factor V Leiden ([place])   . Thyroid disease     Past Surgical History:  Procedure Laterality Date  . EXTERNAL EAR SURGERY    . THYROIDECTOMY  2002  . TYMPANOPLASTY      Current Outpatient Medications on File Prior to Visit  Medication Sig Dispense Refill  . cholecalciferol (VITAMIN D3) 25 MCG (1000 UNIT) tablet Take 1,000 Units by mouth daily.    Marland Kitchen FLUoxetine (PROZAC) 20 MG tablet Take 1 tablet (20 mg total) by mouth daily. 30 tablet 6  . levothyroxine (SYNTHROID)  125 MCG tablet TAKE 1 TABLET BY MOUTH ONCE DAILY BEFORE  BREAKFAST 90 tablet 0  . Multiple Vitamins-Minerals (MULTIVITAMIN ADULT PO) Take by mouth.     No current facility-administered medications on file prior to visit.    Allergies  Allergen Reactions  . Amoxicillin Rash    Family History  Problem Relation Age of Onset  . Hyperlipidemia Mother   . Depression Mother   . Factor V Leiden deficiency Father   . Cancer Sister        thyroid  . Uterine cancer Sister   . Diabetes Maternal Grandmother   . Cancer Maternal Grandmother        thyroid  . Diabetes Maternal Grandfather   . Diabetes Paternal Grandmother   . Diabetes Paternal Grandfather   . Cancer Sister        thyroid    Social History   Socioeconomic History  . Marital status: Married    Spouse name: Not on file  . Number of children: Not on file  . Years of education: Not on file  . Highest education level: Not on file  Occupational History  . Not on file  Tobacco Use  . Smoking status: Never Smoker  . Smokeless tobacco: Never Used  Substance and Sexual Activity  . Alcohol use: No    Comment: rare  . Drug use: No  . Sexual activity: Yes    Partners: Male    Comment: 1st intercourse- 89, partners- 2, married- 1 yrs   Other Topics Concern  . Not on  file  Social History Narrative  . Not on file   Social Determinants of Health   Financial Resource Strain:   . Difficulty of Paying Living Expenses: Not on file  Food Insecurity:   . Worried About Charity fundraiser in the Last Year: Not on file  . Ran Out of Food in the Last Year: Not on file  Transportation Needs:   . Lack of Transportation (Medical): Not on file  . Lack of Transportation (Non-Medical): Not on file  Physical Activity:   . Days of Exercise per Week: Not on file  . Minutes of Exercise per Session: Not on file  Stress:   . Feeling of Stress : Not on file  Social Connections:   . Frequency of Communication with Friends and Family:  Not on file  . Frequency of Social Gatherings with Friends and Family: Not on file  . Attends Religious Services: Not on file  . Active Member of Clubs or Organizations: Not on file  . Attends Archivist Meetings: Not on file  . Marital Status: Not on file  Intimate Partner Violence:   . Fear of Current or Ex-Partner: Not on file  . Emotionally Abused: Not on file  . Physically Abused: Not on file  . Sexually Abused: Not on file   Review of Systems  Constitutional: Negative for fever and weight loss.  HENT: Negative for ear discharge, ear pain, hearing loss and tinnitus.   Eyes: Negative for blurred vision, double vision, photophobia and pain.  Respiratory: Negative for cough and shortness of breath.   Cardiovascular: Negative for chest pain and palpitations.  Gastrointestinal: Negative for abdominal pain, blood in stool, constipation, diarrhea, heartburn, melena, nausea and vomiting.  Genitourinary: Negative for dysuria, flank pain, frequency, hematuria and urgency.  Musculoskeletal: Negative for falls.  Neurological: Negative for dizziness, loss of consciousness and headaches.  Endo/Heme/Allergies: Negative for environmental allergies.  Psychiatric/Behavioral: Negative for depression, hallucinations, substance abuse and suicidal ideas. The patient is not nervous/anxious and does not have insomnia.    There were no vitals taken for this visit.  Physical Exam Constitutional:      Appearance: Normal appearance.  HENT:     Head: Normocephalic and atraumatic.  Eyes:     Conjunctiva/sclera: Conjunctivae normal.  Neurological:     General: No focal deficit present.     Mental Status: She is alert and oriented to person, place, and time.  Psychiatric:        Mood and Affect: Mood normal.    Recent Results (from the past 2160 hour(s))  Novel Coronavirus, NAA ([facility])     Status: None   Collection Time: 01/18/19 11:09 AM   Specimen: Nasopharyngeal(NP) swabs in vial  transport medium   NASOPHARYNGE  TESTING  Result Value Ref Range   SARS-CoV-2, NAA Not Detected Not Detected    Comment: This nucleic acid amplification test was developed and its performance characteristics determined by [employer]. Nucleic acid amplification tests include PCR and TMA. This test has not been FDA cleared or approved. This test has been authorized by FDA under an Emergency Use Authorization (EUA). This test is only authorized for the duration of time the declaration that circumstances exist justifying the authorization of the emergency use of in vitro diagnostic tests for detection of SARS-CoV-2 virus and/or diagnosis of COVID-19 infection under section 564(b)(1) of the Act, 21 U.S.C. 774JOI-7(O) (1), unless the authorization is terminated or revoked sooner. When diagnostic testing is negative, the possibility of a  false negative result should be considered in the context of a patient's recent exposures and the presence of clinical signs and symptoms consistent with COVID-19. An individual without symptoms of COVID-19 and who is not shedding SARS-CoV-2 virus would  expect to have a negative (not detected) result in this assay.     Assessment/Plan: 1. Visit for preventive health examination Depression screen negative. Health Maintenance reviewed --agrees to get flu shot today when she comes in for labs. Preventive schedule discussed and handout given in AVS. Will obtain fasting labs today.  - CBC w/Diff; Future - Comp Met (CMET); Future - Lipid Profile; Future - Hemoglobin A1c; Future - TSH; Future  2. Screening-pulmonary TB - Quantiferon tb gold assay; Future  3. Postoperative hypothyroidism Taking medications as directed.  We will repeat TSH today. - TSH; Future  4. Moderate episode of recurrent major depressive disorder (HCC) Stable.  Continue management per PCP.  5. Obesity (BMI 30-39.9) Dietary and exercise recommendations reviewed with  patient today.  She is going to work on this.  Fasting labs as noted. - Comp Met (CMET); Future - Lipid Profile; Future - Hemoglobin A1c; Future - TSH; Future  6. Vitamin D deficiency Still taking OTC vitamin D3 daily.  Will repeat vitamin D level to ensure in normal range. - Vitamin D (25 hydroxy); Future   Leeanne Rio, PA-C

## 2019-04-10 NOTE — Telephone Encounter (Signed)
Forms completed. Awaiting TB Gold result. Will attach and e-mail forms to patient once completed.

## 2019-04-10 NOTE — Progress Notes (Signed)
I have discussed the procedure for the virtual visit with the patient who has given consent to proceed with assessment and treatment.   Martha Cervantes, CMA      

## 2019-04-10 NOTE — Addendum Note (Signed)
Addended by: Doran Clay A on: 04/10/2019 09:58 AM   Modules accepted: Orders

## 2019-04-10 NOTE — Patient Instructions (Signed)
Instructions sent to MyChart  We will see you later today for blood work, flu shot and TB test. I will call you with results as soon as I have them, and will let you know when sure paperwork is completed so we can decide if you want me to fax a Lucianne Lei or email it to you.  Please work on increasing aerobic exercise to a goal of 150 minutes/week. Watch portion sizes and try to avoid late night eating.   Preventive Care 64-42 Years Old, Female Preventive care refers to visits with your health care provider and lifestyle choices that can promote health and wellness. This includes:  A yearly physical exam. This may also be called an annual well check.  Regular dental visits and eye exams.  Immunizations.  Screening for certain conditions.  Healthy lifestyle choices, such as eating a healthy diet, getting regular exercise, not using drugs or products that contain nicotine and tobacco, and limiting alcohol use. What can I expect for my preventive care visit? Physical exam Your health care provider will check your:  Height and weight. This may be used to calculate body mass index (BMI), which tells if you are at a healthy weight.  Heart rate and blood pressure.  Skin for abnormal spots. Counseling Your health care provider may ask you questions about your:  Alcohol, tobacco, and drug use.  Emotional well-being.  Home and relationship well-being.  Sexual activity.  Eating habits.  Work and work Statistician.  Method of birth control.  Menstrual cycle.  Pregnancy history. What immunizations do I need?  Influenza (flu) vaccine  This is recommended every year. Tetanus, diphtheria, and pertussis (Tdap) vaccine  You may need a Td booster every 10 years. Varicella (chickenpox) vaccine  You may need this if you have not been vaccinated. Zoster (shingles) vaccine  You may need this after age 20. Measles, mumps, and rubella (MMR) vaccine  You may need at least one dose of  MMR if you were born in 1957 or later. You may also need a second dose. Pneumococcal conjugate (PCV13) vaccine  You may need this if you have certain conditions and were not previously vaccinated. Pneumococcal polysaccharide (PPSV23) vaccine  You may need one or two doses if you smoke cigarettes or if you have certain conditions. Meningococcal conjugate (MenACWY) vaccine  You may need this if you have certain conditions. Hepatitis A vaccine  You may need this if you have certain conditions or if you travel or work in places where you may be exposed to hepatitis A. Hepatitis B vaccine  You may need this if you have certain conditions or if you travel or work in places where you may be exposed to hepatitis B. Haemophilus influenzae type b (Hib) vaccine  You may need this if you have certain conditions. Human papillomavirus (HPV) vaccine  If recommended by your health care provider, you may need three doses over 6 months. You may receive vaccines as individual doses or as more than one vaccine together in one shot (combination vaccines). Talk with your health care provider about the risks and benefits of combination vaccines. What tests do I need? Blood tests  Lipid and cholesterol levels. These may be checked every 5 years, or more frequently if you are over 42 years old.  Hepatitis C test.  Hepatitis B test. Screening  Lung cancer screening. You may have this screening every year starting at age 42 if you have a 30-pack-year history of smoking and currently smoke or have  quit within the past 15 years.  Colorectal cancer screening. All adults should have this screening starting at age 42 and continuing until age 14. Your health care provider may recommend screening at age 42 if you are at increased risk. You will have tests every 1-10 years, depending on your results and the type of screening test.  Diabetes screening. This is done by checking your blood sugar (glucose) after you  have not eaten for a while (fasting). You may have this done every 1-3 years.  Mammogram. This may be done every 1-2 years. Talk with your health care provider about when you should start having regular mammograms. This may depend on whether you have a family history of breast cancer.  BRCA-related cancer screening. This may be done if you have a family history of breast, ovarian, tubal, or peritoneal cancers.  Pelvic exam and Pap test. This may be done every 3 years starting at age 42. Starting at age 42, this may be done every 5 years if you have a Pap test in combination with an HPV test. Other tests  Sexually transmitted disease (STD) testing.  Bone density scan. This is done to screen for osteoporosis. You may have this scan if you are at high risk for osteoporosis. Follow these instructions at home: Eating and drinking  Eat a diet that includes fresh fruits and vegetables, whole grains, lean protein, and low-fat dairy.  Take vitamin and mineral supplements as recommended by your health care provider.  Do not drink alcohol if: ? Your health care provider tells you not to drink. ? You are pregnant, may be pregnant, or are planning to become pregnant.  If you drink alcohol: ? Limit how much you have to 0-1 drink a day. ? Be aware of how much alcohol is in your drink. In the [place] one drink equals one 12 oz bottle of beer (355 mL), one 5 oz glass of wine (148 mL), or one 1 oz glass of hard liquor (44 mL). Lifestyle  Take daily care of your teeth and gums.  Stay active. Exercise for at least 30 minutes on 5 or more days each week.  Do not use any products that contain nicotine or tobacco, such as cigarettes, e-cigarettes, and chewing tobacco. If you need help quitting, ask your health care provider.  If you are sexually active, practice safe sex. Use a condom or other form of birth control (contraception) in order to prevent pregnancy and STIs (sexually transmitted infections).   If told by your health care provider, take low-dose aspirin daily starting at age 42. What's next?  Visit your health care provider once a year for a well check visit.  Ask your health care provider how often you should have your eyes and teeth checked.  Stay up to date on all vaccines. This information is not intended to replace advice given to you by your health care provider. Make sure you discuss any questions you have with your health care provider. Document Revised: 11/18/2017 Document Reviewed: 11/18/2017 Elsevier Patient Education  2020 [employer].

## 2019-04-10 NOTE — Addendum Note (Signed)
Addended by: Doran Clay A on: 04/10/2019 09:56 AM   Modules accepted: Orders

## 2019-04-10 NOTE — Telephone Encounter (Signed)
Paperwork at the front desk for you

## 2019-04-12 LAB — QUANTIFERON-TB GOLD PLUS
Mitogen-NIL: >10 IU/mL
NIL: 0.11 IU/mL
QuantiFERON-TB Gold Plus: NEGATIVE
TB1-NIL: 0.01 IU/mL
TB2-NIL: 0.01 IU/mL

## 2019-04-12 NOTE — Telephone Encounter (Signed)
Forms completed and emailed to patient at given email address [email or web address]

## 2019-04-19 ENCOUNTER — Other Ambulatory Visit: Payer: Self-pay | Admitting: Family Medicine

## 2019-04-19 MED ORDER — FLUOXETINE HCL 20 MG PO TABS: 20.0000 mg | ORAL_TABLET | Freq: Every day | ORAL | 1 refills | Status: DC

## 2019-04-19 MED ORDER — LEVOTHYROXINE SODIUM 125 MCG PO TABS: 125.0000 ug | ORAL_TABLET | Freq: Every day | ORAL | 1 refills | Status: DC

## 2019-04-21 ENCOUNTER — Other Ambulatory Visit: Payer: Self-pay | Admitting: General Practice

## 2019-04-21 MED ORDER — FLUOXETINE HCL 20 MG PO CAPS: 20.0000 mg | ORAL_CAPSULE | Freq: Every day | ORAL | 1 refills | Status: DC

## 2019-05-14 ENCOUNTER — Encounter: Payer: Self-pay | Admitting: Family Medicine

## 2019-06-06 ENCOUNTER — Encounter: Payer: Self-pay | Admitting: Family Medicine

## 2019-06-06 ENCOUNTER — Other Ambulatory Visit: Payer: Self-pay

## 2019-06-06 ENCOUNTER — Ambulatory Visit (INDEPENDENT_AMBULATORY_CARE_PROVIDER_SITE_OTHER): Payer: Commercial Managed Care - PPO | Admitting: Family Medicine

## 2019-06-06 DIAGNOSIS — E678 Other specified hyperalimentation: Secondary | ICD-10-CM | POA: Diagnosis not present

## 2019-06-06 DIAGNOSIS — R5383 Other fatigue: Secondary | ICD-10-CM | POA: Diagnosis not present

## 2019-06-06 DIAGNOSIS — R42 Dizziness and giddiness: Secondary | ICD-10-CM

## 2019-06-06 NOTE — Progress Notes (Signed)
   Virtual Visit via Video   I connected with patient on 06/06/19 at 11:00 AM EDT by a video enabled telemedicine application and verified that I am speaking with the correct person using two identifiers.  Location patient: Home Location provider: [facility], Office Persons participating in the virtual visit: Patient, Provider, [place] (Jess B)  I discussed the limitations of evaluation and management by telemedicine and the availability of in person appointments. The patient expressed understanding and agreed to proceed.  Subjective:   HPI:   Dizziness/fatigue- pt reports increased fatigue recently and then this AM she felt very lightheaded.  The intermittent lightheadedness started 'a couple of weeks ago'.  Notes that this is more common with ingestion of high carb meals/snacks.  This AMs episode occurred after a very sugary latte.  She is concerned b/c there is a family hx of DM.  She reports some decreased appetite but no N/V.  ROS:   See pertinent positives and negatives per HPI.  Patient Active Problem List   Diagnosis Date Noted  . Vertigo 03/11/2018  . Physical exam 03/30/2016  . Hypothyroidism 09/27/2015  . Rosacea 09/27/2015  . Depression 09/27/2015  . Hx of migraine headaches 09/27/2015  . Obesity (BMI 30-39.9) 09/27/2015    Social History   Tobacco Use  . Smoking status: Never Smoker  . Smokeless tobacco: Never Used  Substance Use Topics  . Alcohol use: Yes    Alcohol/week: 2.0 standard drinks    Types: 2 Glasses of wine per week    Comment: 1-2 per month, rarely    Current Outpatient Medications:  .  cholecalciferol (VITAMIN D3) 25 MCG (1000 UNIT) tablet, Take 1,000 Units by mouth daily., Disp: , Rfl:  .  FLUoxetine (PROZAC) 20 MG capsule, Take 1 capsule (20 mg total) by mouth daily., Disp: 90 capsule, Rfl: 1 .  levothyroxine (SYNTHROID) 125 MCG tablet, Take 1 tablet (125 mcg total) by mouth daily before breakfast., Disp: 90 tablet, Rfl: 1 .  Multiple  Vitamins-Minerals (MULTIVITAMIN ADULT PO), Take by mouth., Disp: , Rfl:   Allergies  Allergen Reactions  . Amoxicillin Rash    Objective:   There were no vitals taken for this visit. AAOx3, NAD NCAT, EOMI No obvious CN deficits Coloring WNL Pt is able to speak clearly, coherently without shortness of breath or increased work of breathing.  Thought process is linear.  Mood is appropriate.   Assessment and Plan:   Fatigue/Dizziness- new to provider, ongoing for pt.  Seems to have a strong relationship to carb intake.  Encouraged increased water intake, eating throughout the day rather than 3 large meals, limiting carbs when possible.  Will check labs to r/o metabolic cause.  Pt expressed understanding and is in agreement w/ plan.    [name], MD 06/06/2019

## 2019-06-06 NOTE — Progress Notes (Signed)
I have discussed the procedure for the virtual visit with the patient who has given consent to proceed with assessment and treatment.   Pt unable to obtain vitals.   Martha Cervantes, CMA     

## 2019-06-08 ENCOUNTER — Encounter: Payer: Self-pay | Admitting: Family Medicine

## 2019-06-09 ENCOUNTER — Ambulatory Visit (INDEPENDENT_AMBULATORY_CARE_PROVIDER_SITE_OTHER): Payer: Commercial Managed Care - PPO

## 2019-06-09 ENCOUNTER — Other Ambulatory Visit: Payer: Self-pay

## 2019-06-09 DIAGNOSIS — R5383 Other fatigue: Secondary | ICD-10-CM | POA: Diagnosis not present

## 2019-06-09 DIAGNOSIS — R42 Dizziness and giddiness: Secondary | ICD-10-CM

## 2019-06-09 DIAGNOSIS — E678 Other specified hyperalimentation: Secondary | ICD-10-CM

## 2019-06-09 NOTE — Addendum Note (Signed)
Addended by: Fritz Pickerel on: 06/09/2019 03:58 PM   Modules accepted: Orders

## 2019-06-11 ENCOUNTER — Encounter: Payer: Self-pay | Admitting: Family Medicine

## 2019-06-12 LAB — TEST AUTHORIZATION

## 2019-06-12 LAB — T4, FREE: Free T4: 1.1 ng/dL (ref 0.8–1.8)

## 2019-06-12 LAB — HEMOGLOBIN A1C
Hgb A1c MFr Bld: 5.5 % of total Hgb (ref <5.7)
Mean Plasma Glucose: 111 (calc)
eAG (mmol/L): 6.2 (calc)

## 2019-06-12 LAB — BASIC METABOLIC PANEL
BUN: 11 mg/dL (ref 7–25)
CO2: 23 mmol/L (ref 20–32)
Calcium: 9.6 mg/dL (ref 8.6–10.2)
Chloride: 105 mmol/L (ref 98–110)
Creat: 0.78 mg/dL (ref 0.50–1.10)
Glucose, Bld: 85 mg/dL (ref 65–99)
Potassium: 4.4 mmol/L (ref 3.5–5.3)
Sodium: 138 mmol/L (ref 135–146)

## 2019-06-12 LAB — CBC WITH DIFFERENTIAL/PLATELET
Absolute Monocytes: 511 cells/uL (ref 200–950)
Basophils Absolute: 28 cells/uL (ref 0–200)
Basophils Relative: 0.4 %
Eosinophils Absolute: 28 cells/uL (ref 15–500)
Eosinophils Relative: 0.4 %
HCT: 41.9 % (ref 35.0–45.0)
Hemoglobin: 14.1 g/dL (ref 11.7–15.5)
Lymphs Abs: 2105 cells/uL (ref 850–3900)
MCH: 30.4 pg (ref 27.0–33.0)
MCHC: 33.7 g/dL (ref 32.0–36.0)
MCV: 90.3 fL (ref 80.0–100.0)
MPV: 11.4 fL (ref 7.5–12.5)
Monocytes Relative: 7.4 %
Neutro Abs: 4230 cells/uL (ref 1500–7800)
Neutrophils Relative %: 61.3 %
Platelets: 302 10*3/uL (ref 140–400)
RBC: 4.64 10*6/uL (ref 3.80–5.10)
RDW: 12.9 % (ref 11.0–15.0)
Total Lymphocyte: 30.5 %
WBC: 6.9 10*3/uL (ref 3.8–10.8)

## 2019-06-12 LAB — T3, FREE: T3, Free: 2.5 pg/mL (ref 2.3–4.2)

## 2019-06-12 LAB — TSH: TSH: 4.65 mIU/L — ABNORMAL HIGH

## 2019-06-13 ENCOUNTER — Other Ambulatory Visit: Payer: Self-pay | Admitting: General Practice

## 2019-06-13 DIAGNOSIS — E890 Postprocedural hypothyroidism: Secondary | ICD-10-CM

## 2019-06-13 MED ORDER — LEVOTHYROXINE SODIUM 137 MCG PO TABS: 137.0000 ug | ORAL_TABLET | Freq: Every day | ORAL | 6 refills | Status: DC

## 2019-09-06 ENCOUNTER — Encounter: Payer: Self-pay | Admitting: Family Medicine

## 2019-10-17 ENCOUNTER — Ambulatory Visit (INDEPENDENT_AMBULATORY_CARE_PROVIDER_SITE_OTHER): Payer: Managed Care, Other (non HMO) | Admitting: Obstetrics & Gynecology

## 2019-10-17 ENCOUNTER — Other Ambulatory Visit: Payer: Self-pay

## 2019-10-17 ENCOUNTER — Encounter: Payer: Self-pay | Admitting: Obstetrics & Gynecology

## 2019-10-17 VITALS — BP 128/84 | Ht 66.5 in | Wt 238.0 lb

## 2019-10-17 DIAGNOSIS — E6609 Other obesity due to excess calories: Secondary | ICD-10-CM

## 2019-10-17 DIAGNOSIS — Z6837 Body mass index (BMI) 37.0-37.9, adult: Secondary | ICD-10-CM

## 2019-10-17 DIAGNOSIS — Z9189 Other specified personal risk factors, not elsewhere classified: Secondary | ICD-10-CM | POA: Diagnosis not present

## 2019-10-17 DIAGNOSIS — Z01419 Encounter for gynecological examination (general) (routine) without abnormal findings: Secondary | ICD-10-CM | POA: Diagnosis not present

## 2019-10-17 NOTE — Progress Notes (Signed)
Martha Cervantes 1976/03/12 [id number]   History:    44 y.o. G2P2L2 Married. Vasectomy.  Daughters 88 and 76 yo.  RP:  Established patient presenting for annual gyn exam   HPI: Normal menstrual periods every month with normal flow.  No breakthrough bleeding.  No pelvic pain.  No pain with intercourse.  Urine and bowel movements normal.  Breasts normal.  Body mass index 37.84.  Not exercising regularly currently.  Health labs with Dr. Birdie Riddle.   Past medical history,surgical history, family history and social history were all reviewed and documented in the EPIC chart.  Gynecologic History Patient's last menstrual period was 10/17/2019.  Obstetric History OB History  Gravida Para Term Preterm AB Living  2 2       2   SAB TAB Ectopic Multiple Live Births          2    # Outcome Date GA Lbr Len/2nd Weight Sex Delivery Anes PTL Lv  2 Para     F    LIV  1 Para     F Vag-Spont   LIV     ROS: A ROS was performed and pertinent positives and negatives are included in the history.  GENERAL: No fevers or chills. HEENT: No change in vision, no earache, sore throat or sinus congestion. NECK: No pain or stiffness. CARDIOVASCULAR: No chest pain or pressure. No palpitations. PULMONARY: No shortness of breath, cough or wheeze. GASTROINTESTINAL: No abdominal pain, nausea, vomiting or diarrhea, melena or bright red blood per rectum. GENITOURINARY: No urinary frequency, urgency, hesitancy or dysuria. MUSCULOSKELETAL: No joint or muscle pain, no back pain, no recent trauma. DERMATOLOGIC: No rash, no itching, no lesions. ENDOCRINE: No polyuria, polydipsia, no heat or cold intolerance. No recent change in weight. HEMATOLOGICAL: No anemia or easy bruising or bleeding. NEUROLOGIC: No headache, seizures, numbness, tingling or weakness. PSYCHIATRIC: No depression, no loss of interest in normal activity or change in sleep pattern.     Exam:   BP 128/84   Ht 5' 6.5" (1.689 m)   Wt (!) 238 lb (108 kg)   LMP  10/17/2019 Comment: PATIENT'S HUSBAND WITH VASECTOMY   BMI 37.84 kg/m   Body mass index is 37.84 kg/m.  General appearance : Well developed well nourished female. No acute distress HEENT: Eyes: no retinal hemorrhage or exudates,  Neck supple, trachea midline, no carotid bruits, no thyroidmegaly Lungs: Clear to auscultation, no rhonchi or wheezes, or rib retractions  Heart: Regular rate and rhythm, no murmurs or gallops Breast:Examined in sitting and supine position were symmetrical in appearance, no palpable masses or tenderness,  no skin retraction, no nipple inversion, no nipple discharge, no skin discoloration, no axillary or supraclavicular lymphadenopathy Abdomen: no palpable masses or tenderness, no rebound or guarding Extremities: no edema or skin discoloration or tenderness  Pelvic: Vulva: Normal             Vagina: No gross lesions or discharge  Cervix: No gross lesions or discharge  Uterus  AV, normal size, shape and consistency, non-tender and mobile  Adnexa  Without masses or tenderness  Anus: Normal   Assessment/Plan:  44 y.o. female for annual exam   1. Well female exam with routine gynecological exam Normal gynecologic exam.  We will repeat a Pap test at 3 years next year.  Breast exam normal.  Patient will schedule a screening mammogram now.  Health labs with family physician.  2. Relies on partner vasectomy for contraception  3. Class 2 obesity due  to excess calories without serious comorbidity with body mass index (BMI) of 37.0 to 37.9 in adult Low calorie/carb diet such as [employer].  Recommend decreasing portions to decrease calories.  Continue with aerobic activities 5 times a week and light weightlifting every 2 days.  Princess Bruins MD, 2:17 PM 10/17/2019

## 2019-10-18 ENCOUNTER — Encounter: Payer: Self-pay | Admitting: Family Medicine

## 2019-11-05 ENCOUNTER — Encounter: Payer: Self-pay | Admitting: Family Medicine

## 2019-11-05 DIAGNOSIS — E890 Postprocedural hypothyroidism: Secondary | ICD-10-CM

## 2019-11-05 DIAGNOSIS — U071 COVID-19: Secondary | ICD-10-CM

## 2019-11-09 NOTE — Telephone Encounter (Signed)
Pt would like to know if she should get the Covid antibody test and where she can go to do that?   She said she had covid in May and when she got her 1st covid vac she was sick for about 2wks, she wasn't sure if she should get the 2nd vac.

## 2019-11-09 NOTE — Telephone Encounter (Signed)
Please advise on lab appt and when she needs to have this done?

## 2019-11-11 ENCOUNTER — Other Ambulatory Visit: Payer: Self-pay | Admitting: Family Medicine

## 2019-11-15 ENCOUNTER — Ambulatory Visit (INDEPENDENT_AMBULATORY_CARE_PROVIDER_SITE_OTHER): Payer: Managed Care, Other (non HMO)

## 2019-11-15 ENCOUNTER — Other Ambulatory Visit: Payer: Self-pay

## 2019-11-15 DIAGNOSIS — E890 Postprocedural hypothyroidism: Secondary | ICD-10-CM

## 2019-11-16 ENCOUNTER — Encounter: Payer: Self-pay | Admitting: General Practice

## 2019-11-16 LAB — TSH: TSH: 2.14 u[IU]/mL (ref 0.35–4.50)

## 2019-11-17 ENCOUNTER — Other Ambulatory Visit: Payer: Self-pay

## 2019-11-22 ENCOUNTER — Other Ambulatory Visit: Payer: Self-pay

## 2019-11-22 ENCOUNTER — Ambulatory Visit (INDEPENDENT_AMBULATORY_CARE_PROVIDER_SITE_OTHER): Payer: Managed Care, Other (non HMO)

## 2019-11-22 DIAGNOSIS — U071 COVID-19: Secondary | ICD-10-CM

## 2019-11-23 LAB — SARS-COV-2 ANTIBODY, IGM: SARS-CoV-2 Antibody, IgM: NEGATIVE

## 2020-01-07 ENCOUNTER — Encounter: Payer: Self-pay | Admitting: Family Medicine

## 2020-01-15 ENCOUNTER — Telehealth (INDEPENDENT_AMBULATORY_CARE_PROVIDER_SITE_OTHER): Payer: Managed Care, Other (non HMO) | Admitting: Family Medicine

## 2020-01-15 ENCOUNTER — Encounter: Payer: Self-pay | Admitting: Family Medicine

## 2020-01-15 DIAGNOSIS — F419 Anxiety disorder, unspecified: Secondary | ICD-10-CM | POA: Diagnosis not present

## 2020-01-15 DIAGNOSIS — F32A Depression, unspecified: Secondary | ICD-10-CM | POA: Diagnosis not present

## 2020-01-15 MED ORDER — FLUOXETINE HCL 40 MG PO CAPS: 40.0000 mg | ORAL_CAPSULE | Freq: Every day | ORAL | 3 refills | Status: DC

## 2020-01-15 MED ORDER — ALPRAZOLAM 0.5 MG PO TABS: ORAL_TABLET | ORAL | 1 refills | Status: DC

## 2020-01-15 NOTE — Progress Notes (Signed)
   Virtual Visit via Video   I connected with patient on 01/15/20 at  2:30 PM EDT by a video enabled telemedicine application and verified that I am speaking with the correct person using two identifiers.  Location patient: Home Location provider: [facility], Office Persons participating in the virtual visit: Patient, Provider, [place] (Sabrina M)  I discussed the limitations of evaluation and management by telemedicine and the availability of in person appointments. The patient expressed understanding and agreed to proceed.  Subjective:   HPI:   Anxiety- pt is currently on Fluoxetine 20mg  daily.  'my anxiety has been really ramped up lately'.  Started working in January which is 'harder and more stressful than I thought'.  Stress is mostly work based, 'I feel like I get over stimulated'.  'visually overwhelming'.  + HAs.  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  Pt reports she gets dizzy and starts sweating and feels that she is going to pass out during a panic attack.  ROS:   See pertinent positives and negatives per HPI.  Patient Active Problem List   Diagnosis Date Noted  . Vertigo 03/11/2018  . Physical exam 03/30/2016  . Hypothyroidism 09/27/2015  . Rosacea 09/27/2015  . Depression 09/27/2015  . Hx of migraine headaches 09/27/2015  . Obesity (BMI 30-39.9) 09/27/2015    Social History   Tobacco Use  . Smoking status: Never Smoker  . Smokeless tobacco: Never Used  Substance Use Topics  . Alcohol use: Yes    Alcohol/week: 2.0 standard drinks    Types: 2 Glasses of wine per week    Comment: 1-2 per month, rarely    Current Outpatient Medications:  .  FLUoxetine (PROZAC) 20 MG capsule, TAKE 1 CAPSULE BY MOUTH DAILY, Disp: 90 capsule, Rfl: 0 .  levothyroxine (SYNTHROID) 137 MCG tablet, Take 1 tablet (137 mcg total) by mouth daily before breakfast., Disp: 30 tablet, Rfl: 6 .  Multiple Vitamins-Minerals (MULTIVITAMIN ADULT  PO), Take by mouth., Disp: , Rfl:  .  cholecalciferol (VITAMIN D3) 25 MCG (1000 UNIT) tablet, Take 1,000 Units by mouth daily. (Patient not taking: Reported on 01/15/2020), Disp: , Rfl:   Allergies  Allergen Reactions  . Amoxicillin Rash    Objective:   There were no vitals taken for this visit. AAOx3, NAD NCAT, EOMI No obvious CN deficits Coloring WNL Pt is able to speak clearly, coherently without shortness of breath or increased work of breathing.  Thought process is linear.  Mood is appropriate.   Assessment and Plan:   Depression/Anxiety- Deteriorated.  Pt feels depression is well controlled on her Fluoxetine 20mg  daily but admits she is really struggling w/ anxiety.  Will increase Fluoxetine to 40mg  daily and will add rescue medication to use if needed.  Will follow closely.   [name], MD 01/15/2020

## 2020-01-15 NOTE — Progress Notes (Signed)
I connected with  Martha Cervantes on 01/15/20 by a video enabled telemedicine application and verified that I am speaking with the correct person using two identifiers.   I discussed the limitations of evaluation and management by telemedicine. The patient expressed understanding and agreed to proceed.

## 2020-01-24 ENCOUNTER — Telehealth: Payer: Self-pay | Admitting: Family Medicine

## 2020-01-24 ENCOUNTER — Encounter: Payer: Self-pay | Admitting: Family Medicine

## 2020-01-24 NOTE — Telephone Encounter (Signed)
LM for pt to call back to schedule a 15mth reck anxiety from appt on 01/15/20

## 2020-01-27 ENCOUNTER — Other Ambulatory Visit: Payer: Self-pay | Admitting: Family Medicine

## 2020-02-22 ENCOUNTER — Encounter: Payer: Self-pay | Admitting: Family Medicine

## 2020-02-22 ENCOUNTER — Telehealth (INDEPENDENT_AMBULATORY_CARE_PROVIDER_SITE_OTHER): Payer: Managed Care, Other (non HMO) | Admitting: Family Medicine

## 2020-02-22 DIAGNOSIS — F419 Anxiety disorder, unspecified: Secondary | ICD-10-CM

## 2020-02-22 DIAGNOSIS — F32A Depression, unspecified: Secondary | ICD-10-CM

## 2020-02-22 NOTE — Progress Notes (Signed)
   Virtual Visit via Video   I connected with patient on 02/22/20 at  8:30 AM EST by a video enabled telemedicine application and verified that I am speaking with the correct person using two identifiers.  Location patient: Home Location provider: Acupuncturist, Office Persons participating in the virtual visit: Patient, Provider, [place] (Katie N)  I discussed the limitations of evaluation and management by telemedicine and the availability of in person appointments. The patient expressed understanding and agreed to proceed.  Subjective:   HPI:   Anxiety/Depression- at last visit, Fluoxetine was increased to 40mg  and Alprazolam was added to use as needed for panicked moments.  She feels sxs are much improved since making the changes  ROS:   See pertinent positives and negatives per HPI.  Patient Active Problem List   Diagnosis Date Noted  . Vertigo 03/11/2018  . Physical exam 03/30/2016  . Hypothyroidism 09/27/2015  . Rosacea 09/27/2015  . Depression 09/27/2015  . Hx of migraine headaches 09/27/2015  . Obesity (BMI 30-39.9) 09/27/2015    Social History   Tobacco Use  . Smoking status: Never Smoker  . Smokeless tobacco: Never Used  Substance Use Topics  . Alcohol use: Yes    Alcohol/week: 2.0 standard drinks    Types: 2 Glasses of wine per week    Comment: 1-2 per month, rarely    Current Outpatient Medications:  .  ALPRAZolam (XANAX) 0.5 MG tablet, 1/2-1 tab BID as needed for anxiety, Disp: 30 tablet, Rfl: 1 .  cholecalciferol (VITAMIN D3) 25 MCG (1000 UNIT) tablet, Take 1,000 Units by mouth daily. (Patient not taking: Reported on 01/15/2020), Disp: , Rfl:  .  FLUoxetine (PROZAC) 40 MG capsule, Take 1 capsule (40 mg total) by mouth daily., Disp: 30 capsule, Rfl: 3 .  levothyroxine (SYNTHROID) 137 MCG tablet, TAKE 1 TABLET BY MOUTH ONCE DAILY BEFORE BREAKFAST, Disp: 30 tablet, Rfl: 0 .  Multiple Vitamins-Minerals (MULTIVITAMIN ADULT PO), Take by mouth., Disp: , Rfl:    Allergies  Allergen Reactions  . Amoxicillin Rash    Objective:   There were no vitals taken for this visit. AAOx3, NAD NCAT, EOMI No obvious CN deficits Coloring WNL Pt is able to speak clearly, coherently without shortness of breath or increased work of breathing.  Thought process is linear.  Mood is appropriate.   Assessment and Plan:   Anxiety/Depression- much improved w/ increased dose of Fluoxetine.  Has rarely had to take Alprazolam.  Overall, feels mood is better and she is more in control.  No changes at this time   [name], MD 02/22/2020

## 2020-02-28 ENCOUNTER — Other Ambulatory Visit: Payer: Self-pay | Admitting: Family Medicine

## 2020-02-28 MED ORDER — LEVOTHYROXINE SODIUM 137 MCG PO TABS: 137.0000 ug | ORAL_TABLET | Freq: Every day | ORAL | 3 refills | Status: DC

## 2020-06-16 ENCOUNTER — Other Ambulatory Visit: Payer: Self-pay | Admitting: Family Medicine

## 2020-06-16 ENCOUNTER — Encounter: Payer: Self-pay | Admitting: Family Medicine

## 2020-06-17 ENCOUNTER — Other Ambulatory Visit: Payer: Self-pay

## 2020-06-17 DIAGNOSIS — F419 Anxiety disorder, unspecified: Secondary | ICD-10-CM

## 2020-06-17 MED ORDER — FLUOXETINE HCL 40 MG PO CAPS: 40.0000 mg | ORAL_CAPSULE | Freq: Every day | ORAL | 0 refills | Status: DC

## 2020-06-18 ENCOUNTER — Other Ambulatory Visit: Payer: Self-pay

## 2020-06-18 ENCOUNTER — Encounter: Payer: Self-pay | Admitting: Family Medicine

## 2020-06-18 DIAGNOSIS — F331 Major depressive disorder, recurrent, moderate: Secondary | ICD-10-CM

## 2020-06-18 MED ORDER — FLUOXETINE HCL 20 MG PO CAPS: 20.0000 mg | ORAL_CAPSULE | Freq: Every day | ORAL | 0 refills | Status: DC

## 2020-06-23 ENCOUNTER — Encounter: Payer: Self-pay | Admitting: Family Medicine

## 2020-06-24 ENCOUNTER — Telehealth: Payer: Managed Care, Other (non HMO) | Admitting: Physician Assistant

## 2020-06-25 ENCOUNTER — Encounter: Payer: Self-pay | Admitting: Family Medicine

## 2020-06-25 ENCOUNTER — Telehealth (INDEPENDENT_AMBULATORY_CARE_PROVIDER_SITE_OTHER): Payer: BC Managed Care – PPO | Admitting: Physician Assistant

## 2020-06-25 ENCOUNTER — Other Ambulatory Visit: Payer: Self-pay

## 2020-06-25 ENCOUNTER — Other Ambulatory Visit: Payer: Self-pay | Admitting: Family Medicine

## 2020-06-25 DIAGNOSIS — J0110 Acute frontal sinusitis, unspecified: Secondary | ICD-10-CM

## 2020-06-25 DIAGNOSIS — E890 Postprocedural hypothyroidism: Secondary | ICD-10-CM

## 2020-06-25 MED ORDER — LEVOTHYROXINE SODIUM 137 MCG PO TABS: 137.0000 ug | ORAL_TABLET | Freq: Every day | ORAL | 1 refills | Status: DC

## 2020-06-25 MED ORDER — DOXYCYCLINE HYCLATE 100 MG PO TABS: 100.0000 mg | ORAL_TABLET | Freq: Two times a day (BID) | ORAL | 0 refills | Status: AC

## 2020-06-25 NOTE — Progress Notes (Signed)
Virtual Visit via Video Note  I connected with Martha Cervantes on 06/25/20 at  9:00 AM EDT by a video enabled telemedicine application and verified that I am speaking with the correct person using two identifiers.  Location: Patient: Work Secondary school teacher: Therapist, music at [employer]   I discussed the limitations of evaluation and management by telemedicine and the availability of in person appointments. The patient expressed understanding and agreed to proceed.  Only the patient and myself were present for today's video call.   History of Present Illness: Chief complaint: Facial / sinus pain Symptom onset: 1.5 weeks ago  Pertinent positives: Minimal congestion Pertinent negatives: Fever, chills, cough, SOB, chest pain  Treatments tried: Advil, Benadryl Vaccine status: 1/2 vaccinated for COVID, no flu shot Sick exposure: Works for an [employer] school     Observations/Objective:  Gen: Awake, alert, no acute distress Resp: Breathing is even and non-labored Psych: calm/pleasant demeanor Neuro: Alert and Oriented x 3, + facial symmetry, speech is clear.   Assessment and Plan: 1. Acute non-recurrent frontal sinusitis >10 days of symptoms may warrant antibiotic treatment at this time. Will start on doxycycline as directed for likely frontal sinusitis. Also advised nasal saline and warm compresses. Tylenol prn. She will recheck if worse or no improvement.   Follow Up Instructions:    I discussed the assessment and treatment plan with the patient. The patient was provided an opportunity to ask questions and all were answered. The patient agreed with the plan and demonstrated an understanding of the instructions.   The patient was advised to call back or seek an in-person evaluation if the symptoms worsen or if the condition fails to improve as anticipated.  Patrcia Schnepp M Harnoor Reta, PA-C

## 2020-06-25 NOTE — Patient Instructions (Signed)
Take antibiotics as directed. Follow up if worse or no improvement.

## 2020-09-12 ENCOUNTER — Other Ambulatory Visit: Payer: Self-pay | Admitting: Family Medicine

## 2020-09-12 DIAGNOSIS — F331 Major depressive disorder, recurrent, moderate: Secondary | ICD-10-CM

## 2020-09-18 ENCOUNTER — Encounter: Payer: Self-pay | Admitting: *Deleted

## 2020-10-17 ENCOUNTER — Other Ambulatory Visit (HOSPITAL_COMMUNITY)
Admission: RE | Admit: 2020-10-17 | Discharge: 2020-10-17 | Disposition: A | Discharge status: Home / Self Care | Payer: BC Managed Care – PPO | Source: Ambulatory Visit | Attending: Obstetrics & Gynecology | Admitting: Obstetrics & Gynecology

## 2020-10-17 ENCOUNTER — Other Ambulatory Visit: Payer: Self-pay

## 2020-10-17 ENCOUNTER — Encounter: Payer: Self-pay | Admitting: Obstetrics & Gynecology

## 2020-10-17 ENCOUNTER — Ambulatory Visit (INDEPENDENT_AMBULATORY_CARE_PROVIDER_SITE_OTHER): Payer: BC Managed Care – PPO | Admitting: Obstetrics & Gynecology

## 2020-10-17 VITALS — BP 116/76 | HR 88 | Resp 16 | Ht 66.25 in | Wt 256.0 lb

## 2020-10-17 DIAGNOSIS — Z6841 Body Mass Index (BMI) 40.0 and over, adult: Secondary | ICD-10-CM | POA: Diagnosis not present

## 2020-10-17 DIAGNOSIS — Z01419 Encounter for gynecological examination (general) (routine) without abnormal findings: Secondary | ICD-10-CM | POA: Diagnosis not present

## 2020-10-17 DIAGNOSIS — Z9189 Other specified personal risk factors, not elsewhere classified: Secondary | ICD-10-CM | POA: Diagnosis not present

## 2020-10-17 NOTE — Progress Notes (Signed)
Martha Cervantes [birth year] [id number]   History:    45 y.o. G2P2L2 Married. Vasectomy.  Daughters 27 and 35 yo.   RP:  Established patient presenting for annual gyn exam   HPI: Normal menstrual periods every 4-6 weeks with normal flow.  No breakthrough bleeding.  No pelvic pain.  No pain with intercourse.  Urine and bowel movements normal.  Breasts normal.  Body mass index 41.01.  Not exercising regularly currently.  Health labs with Dr. Birdie Riddle.   Past medical history,surgical history, family history and social history were all reviewed and documented in the EPIC chart.  Gynecologic History Patient's last menstrual period was 10/08/2020 (exact date).  Obstetric History OB History  Gravida Para Term Preterm AB Living  '2 2       2  '$ SAB IAB Ectopic Multiple Live Births          2    # Outcome Date GA Lbr Len/2nd Weight Sex Delivery Anes PTL Lv  2 Para     F    LIV  1 Para     F Vag-Spont   LIV     ROS: A ROS was performed and pertinent positives and negatives are included in the history.  GENERAL: No fevers or chills. HEENT: No change in vision, no earache, sore throat or sinus congestion. NECK: No pain or stiffness. CARDIOVASCULAR: No chest pain or pressure. No palpitations. PULMONARY: No shortness of breath, cough or wheeze. GASTROINTESTINAL: No abdominal pain, nausea, vomiting or diarrhea, melena or bright red blood per rectum. GENITOURINARY: No urinary frequency, urgency, hesitancy or dysuria. MUSCULOSKELETAL: No joint or muscle pain, no back pain, no recent trauma. DERMATOLOGIC: No rash, no itching, no lesions. ENDOCRINE: No polyuria, polydipsia, no heat or cold intolerance. No recent change in weight. HEMATOLOGICAL: No anemia or easy bruising or bleeding. NEUROLOGIC: No headache, seizures, numbness, tingling or weakness. PSYCHIATRIC: No depression, no loss of interest in normal activity or change in sleep pattern.     Exam:   BP 116/76   Pulse 88   Resp 16   Ht 5' 6.25"  (1.683 m)   Wt 256 lb (116.1 kg)   LMP 10/08/2020 (Exact Date)   BMI 41.01 kg/m   Body mass index is 41.01 kg/m.  General appearance : Well developed well nourished female. No acute distress HEENT: Eyes: no retinal hemorrhage or exudates,  Neck supple, trachea midline, no carotid bruits, no thyroidmegaly Lungs: Clear to auscultation, no rhonchi or wheezes, or rib retractions  Heart: Regular rate and rhythm, no murmurs or gallops Breast:Examined in sitting and supine position were symmetrical in appearance, no palpable masses or tenderness,  no skin retraction, no nipple inversion, no nipple discharge, no skin discoloration, no axillary or supraclavicular lymphadenopathy Abdomen: no palpable masses or tenderness, no rebound or guarding Extremities: no edema or skin discoloration or tenderness  Pelvic: Vulva: Normal             Vagina: No gross lesions or discharge  Cervix: No gross lesions or discharge.  Pap reflex done.  Uterus  AV, normal size, shape and consistency, non-tender and mobile  Adnexa  Without masses or tenderness  Anus: Normal   Assessment/Plan:  45 y.o. female for annual exam   1. Encounter for routine gynecological examination with Papanicolaou smear of cervix Normal gynecologic exam.  Pap reflex done.  Breast exam normal.  Will schedule a screening mammogram.  Health labs with family physician. - Cytology - PAP( )  2. Relies  on partner vasectomy for contraception  3. Class 3 severe obesity due to excess calories without serious comorbidity with body mass index (BMI) of 40.0 to 44.9 in adult [facility])  Recommend a lower calorie/carb diet.  Aerobic activities 5 times a week and light weightlifting every 2 days.  Princess Bruins MD, 2:26 PM 10/17/2020

## 2020-10-21 ENCOUNTER — Encounter: Payer: Self-pay | Admitting: Obstetrics & Gynecology

## 2020-10-21 LAB — CYTOLOGY - PAP: Diagnosis: NEGATIVE

## 2020-10-28 ENCOUNTER — Encounter: Payer: Self-pay | Admitting: *Deleted

## 2020-11-21 ENCOUNTER — Encounter: Payer: Self-pay | Admitting: Family Medicine

## 2020-12-11 ENCOUNTER — Other Ambulatory Visit: Payer: Self-pay | Admitting: Family Medicine

## 2020-12-11 DIAGNOSIS — E890 Postprocedural hypothyroidism: Secondary | ICD-10-CM

## 2020-12-11 DIAGNOSIS — F331 Major depressive disorder, recurrent, moderate: Secondary | ICD-10-CM

## 2021-01-16 ENCOUNTER — Encounter: Payer: Self-pay | Admitting: Family Medicine

## 2021-03-11 ENCOUNTER — Other Ambulatory Visit: Payer: Self-pay | Admitting: Family Medicine

## 2021-03-11 DIAGNOSIS — E890 Postprocedural hypothyroidism: Secondary | ICD-10-CM

## 2021-03-11 DIAGNOSIS — F331 Major depressive disorder, recurrent, moderate: Secondary | ICD-10-CM

## 2021-03-30 ENCOUNTER — Other Ambulatory Visit: Payer: Self-pay | Admitting: Family Medicine

## 2021-03-30 DIAGNOSIS — E890 Postprocedural hypothyroidism: Secondary | ICD-10-CM

## 2021-03-30 DIAGNOSIS — F331 Major depressive disorder, recurrent, moderate: Secondary | ICD-10-CM

## 2021-03-31 MED ORDER — FLUOXETINE HCL 20 MG PO CAPS: 20.0000 mg | ORAL_CAPSULE | Freq: Every day | ORAL | 0 refills | Status: DC

## 2021-03-31 MED ORDER — LEVOTHYROXINE SODIUM 137 MCG PO TABS: 137.0000 ug | ORAL_TABLET | Freq: Every day | ORAL | 0 refills | Status: DC

## 2021-04-27 ENCOUNTER — Telehealth: Payer: BC Managed Care – PPO | Admitting: Family

## 2021-04-27 DIAGNOSIS — J029 Acute pharyngitis, unspecified: Secondary | ICD-10-CM

## 2021-04-27 DIAGNOSIS — Z20818 Contact with and (suspected) exposure to other bacterial communicable diseases: Secondary | ICD-10-CM

## 2021-04-27 MED ORDER — AZITHROMYCIN 250 MG PO TABS: ORAL_TABLET | ORAL | 0 refills | Status: DC

## 2021-04-27 NOTE — Progress Notes (Signed)
E-Visit for Sore Throat - Strep Symptoms  We are sorry that you are not feeling well.  Here is how we plan to help!  Based on what you have shared with me it is likely that you have strep pharyngitis.  Strep pharyngitis is inflammation and infection in the back of the throat.  This is an infection cause by bacteria and is treated with antibiotics.  I have prescribed Azithromycin 250 mg two tablets today and then one daily for 4 additional days. For throat pain, we recommend over the counter oral pain relief medications such as acetaminophen or aspirin, or anti-inflammatory medications such as ibuprofen or naproxen sodium. Topical treatments such as oral throat lozenges or sprays may be used as needed. Strep infections are not as easily transmitted as other respiratory infections, however we still recommend that you avoid close contact with loved ones, especially the very young and elderly.  Remember to wash your hands thoroughly throughout the day as this is the number one way to prevent the spread of infection and wipe down door knobs and counters with disinfectant.   Home Care: Only take medications as instructed by your medical team. Complete the entire course of an antibiotic. Do not take these medications with alcohol. A steam or ultrasonic humidifier can help congestion.  You can place a towel over your head and breathe in the steam from hot water coming from a faucet. Avoid close contacts especially the very young and the elderly. Cover your mouth when you cough or sneeze. Always remember to wash your hands.  Get Help Right Away If: You develop worsening fever or sinus pain. You develop a severe head ache or visual changes. Your symptoms persist after you have completed your treatment plan.  Make sure you Understand these instructions. Will watch your condition. Will get help right away if you are not doing well or get worse.   Thank you for choosing an e-visit.  Your e-visit  answers were reviewed by a board certified advanced clinical practitioner to complete your personal care plan. Depending upon the condition, your plan could have included both over the counter or prescription medications.  Please review your pharmacy choice. Make sure the pharmacy is open so you can pick up prescription now. If there is a problem, you may contact your provider through MyChart messaging and have the prescription routed to another pharmacy.  Your safety is important to us. If you have drug allergies check your prescription carefully.   For the next 24 hours you can use MyChart to ask questions about today's visit, request a non-urgent call back, or ask for a work or school excuse. You will get an email in the next two days asking about your experience. I hope that your e-visit has been valuable and will speed your recovery.  Approximately 5 minutes was spent documenting and reviewing patient's chart.     

## 2021-05-07 ENCOUNTER — Other Ambulatory Visit: Payer: Self-pay | Admitting: Family Medicine

## 2021-05-07 DIAGNOSIS — E890 Postprocedural hypothyroidism: Secondary | ICD-10-CM

## 2021-05-07 DIAGNOSIS — F331 Major depressive disorder, recurrent, moderate: Secondary | ICD-10-CM

## 2021-05-08 ENCOUNTER — Encounter: Payer: Self-pay | Admitting: Family Medicine

## 2021-05-08 DIAGNOSIS — F331 Major depressive disorder, recurrent, moderate: Secondary | ICD-10-CM

## 2021-05-08 DIAGNOSIS — E890 Postprocedural hypothyroidism: Secondary | ICD-10-CM

## 2021-05-08 NOTE — Telephone Encounter (Signed)
Patient is requesting a refill of the following medications: Requested Prescriptions   Pending Prescriptions Disp Refills   FLUoxetine (PROZAC) 20 MG capsule 30 capsule 0    Sig: Take 1 capsule (20 mg total) by mouth daily. LAST REFILL WITHOUT AN APPT   levothyroxine (SYNTHROID) 137 MCG tablet 30 tablet 0    Sig: Take 1 tablet (137 mcg total) by mouth daily before breakfast. LAST REFILL WITHOUT AN APPT    Date of patient request: 05/07/2021 Last office visit: 02/22/2020 Date of last refill: 03/31/2021 Last refill amount: 30 capsules  Follow up time period per chart: none

## 2021-05-09 MED ORDER — LEVOTHYROXINE SODIUM 137 MCG PO TABS: 137.0000 ug | ORAL_TABLET | Freq: Every day | ORAL | 0 refills | Status: DC

## 2021-05-09 MED ORDER — FLUOXETINE HCL 20 MG PO CAPS: 20.0000 mg | ORAL_CAPSULE | Freq: Every day | ORAL | 0 refills | Status: DC

## 2021-05-09 NOTE — Telephone Encounter (Signed)
Pt is scheduled for an appt on 05/19/21 she would like the synthroid and the prozac called in today since she is out.  Please advise

## 2021-05-19 ENCOUNTER — Telehealth (INDEPENDENT_AMBULATORY_CARE_PROVIDER_SITE_OTHER): Payer: Self-pay | Admitting: Family Medicine

## 2021-05-19 ENCOUNTER — Telehealth: Payer: Self-pay | Admitting: Family Medicine

## 2021-05-19 ENCOUNTER — Encounter: Payer: Self-pay | Admitting: Family Medicine

## 2021-05-19 VITALS — Wt 245.0 lb

## 2021-05-19 DIAGNOSIS — E669 Obesity, unspecified: Secondary | ICD-10-CM

## 2021-05-19 DIAGNOSIS — E890 Postprocedural hypothyroidism: Secondary | ICD-10-CM

## 2021-05-19 DIAGNOSIS — F339 Major depressive disorder, recurrent, unspecified: Secondary | ICD-10-CM

## 2021-05-19 NOTE — Telephone Encounter (Signed)
I have LM for f/up information    Follow-up disposition: Return in about 6 months (around 11/16/2021) for complete physical.  Check out comments: Needs lab visit at her convenience in the next few weeks

## 2021-05-19 NOTE — Progress Notes (Signed)
° °  Virtual Visit via Video   I connected with patient on 05/19/21 at  8:00 AM EST by a video enabled telemedicine application and verified that I am speaking with the correct person using two identifiers.  Location patient: Home Location provider: [facility], Office Persons participating in the virtual visit: Patient, Provider, Martha Claiborne Billings C)  I discussed the limitations of evaluation and management by telemedicine and the availability of in person appointments. The patient expressed understanding and agreed to proceed.  Subjective:   HPI:   Hypothyroid- chronic problem, on Levothyroxine 168mcg daily.  Pt reports good energy level, denies hair loss.  No changes to skin or nails.  Denies constipation.  Depression- chronic problem, on Fluoxetine 20mg  daily.  Pt reports only needing 3 1/2 tabs of Alprazolam since last prescription.  Pt reports anxiety is under better control.  Job is 'more comfortable'.  Feels that Fluoxetine dose is great and not interested in change at this time.  Obesity- pt's BMI 39.25 based on her home scale.  Pt is not exercising.  'i need to lose weight.  I'm too heavy'.  Husband is in agreement w/ healthy diet and weight loss so she has support at home.  ROS:   See pertinent positives and negatives per HPI.  Patient Active Problem List   Diagnosis Date Noted   Vertigo 03/11/2018   Physical exam 03/30/2016   Hypothyroidism 09/27/2015   Rosacea 09/27/2015   Depression 09/27/2015   Hx of migraine headaches 09/27/2015   Obesity (BMI 30-39.9) 09/27/2015    Social History   Tobacco Use   Smoking status: Never   Smokeless tobacco: Never  Substance Use Topics   Alcohol use: Not Currently    Comment: 0-1 glass of wine a month    Current Outpatient Medications:    ALPRAZolam (XANAX) 0.5 MG tablet, 1/2-1 tab BID as needed for anxiety, Disp: 30 tablet, Rfl: 1   FLUoxetine (PROZAC) 20 MG capsule, Take 1 capsule (20 mg total) by mouth daily. LAST REFILL  WITHOUT AN APPT, Disp: 30 capsule, Rfl: 0   levothyroxine (SYNTHROID) 137 MCG tablet, Take 1 tablet (137 mcg total) by mouth daily before breakfast. LAST REFILL WITHOUT AN APPT, Disp: 30 tablet, Rfl: 0   azithromycin (ZITHROMAX) 250 MG tablet, Take 500 mg once, then 250 mg for four days (Patient not taking: Reported on 05/19/2021), Disp: 6 tablet, Rfl: 0  Allergies  Allergen Reactions   Amoxicillin Rash    Objective:   Wt 245 lb (111.1 kg) Comment: pt reported   BMI 39.25 kg/m  AAOx3, NAD NCAT, EOMI No obvious CN deficits Coloring WNL Pt is able to speak clearly, coherently without shortness of breath or increased work of breathing.  Thought process is linear.  Mood is appropriate.   Assessment and Plan:   Hypothyroid- chronic problem.  Currently asymptomatic.  Check labs.  Adjust meds prn   Depression-  pt reports current sxs are very well controlled and feels that things are going well.  She is not interested in med changes at this time.  Will continue Fluoxetine at 20mg  daily  Obesity- pt's reported weight is 245 which makes BMI 29.25  She states she is aware she needs to lose weight.  Has discussed this w/ her husband and they are in agreement that they need to eat better and exercise more.  Will check labs to risk stratify and follow weight loss progress.   [name], MD 05/19/2021

## 2021-06-02 ENCOUNTER — Other Ambulatory Visit (INDEPENDENT_AMBULATORY_CARE_PROVIDER_SITE_OTHER): Payer: 59

## 2021-06-02 DIAGNOSIS — E890 Postprocedural hypothyroidism: Secondary | ICD-10-CM | POA: Diagnosis not present

## 2021-06-02 DIAGNOSIS — E669 Obesity, unspecified: Secondary | ICD-10-CM

## 2021-06-03 LAB — BASIC METABOLIC PANEL
BUN: 13 mg/dL (ref 6–23)
CO2: 26 mEq/L (ref 19–32)
Calcium: 9.9 mg/dL (ref 8.4–10.5)
Chloride: 99 mEq/L (ref 96–112)
Creatinine, Ser: 0.72 mg/dL (ref 0.40–1.20)
GFR: 102.29 mL/min (ref >60.00)
Glucose, Bld: 82 mg/dL (ref 70–99)
Potassium: 4.1 mEq/L (ref 3.5–5.1)
Sodium: 135 mEq/L (ref 135–145)

## 2021-06-03 LAB — CBC WITH DIFFERENTIAL/PLATELET
Basophils Absolute: 0.1 10*3/uL (ref 0.0–0.1)
Basophils Relative: 0.6 % (ref 0.0–3.0)
Eosinophils Absolute: 0.1 10*3/uL (ref 0.0–0.7)
Eosinophils Relative: 1.1 % (ref 0.0–5.0)
HCT: 42 % (ref 36.0–46.0)
Hemoglobin: 14.2 g/dL (ref 12.0–15.0)
Lymphocytes Relative: 24.7 % (ref 12.0–46.0)
Lymphs Abs: 2.4 10*3/uL (ref 0.7–4.0)
MCHC: 33.9 g/dL (ref 30.0–36.0)
MCV: 89.7 fl (ref 78.0–100.0)
Monocytes Absolute: 1 10*3/uL (ref 0.1–1.0)
Monocytes Relative: 10 % (ref 3.0–12.0)
Neutro Abs: 6.2 10*3/uL (ref 1.4–7.7)
Neutrophils Relative %: 63.6 % (ref 43.0–77.0)
Platelets: 312 10*3/uL (ref 150.0–400.0)
RBC: 4.69 Mil/uL (ref 3.87–5.11)
RDW: 13.1 % (ref 11.5–15.5)
WBC: 9.8 10*3/uL (ref 4.0–10.5)

## 2021-06-03 LAB — HEPATIC FUNCTION PANEL
ALT: 61 U/L — ABNORMAL HIGH (ref 0–35)
AST: 36 U/L (ref 0–37)
Albumin: 4.5 g/dL (ref 3.5–5.2)
Alkaline Phosphatase: 96 U/L (ref 39–117)
Bilirubin, Direct: 0.1 mg/dL (ref 0.0–0.3)
Total Bilirubin: 0.4 mg/dL (ref 0.2–1.2)
Total Protein: 6.7 g/dL (ref 6.0–8.3)

## 2021-06-03 LAB — LIPID PANEL
Cholesterol: 202 mg/dL — ABNORMAL HIGH (ref 0–200)
HDL: 52.2 mg/dL (ref >39.00)
LDL Cholesterol: 126 mg/dL — ABNORMAL HIGH (ref 0–99)
NonHDL: 149.9
Total CHOL/HDL Ratio: 4
Triglycerides: 120 mg/dL (ref 0.0–149.0)
VLDL: 24 mg/dL (ref 0.0–40.0)

## 2021-06-03 LAB — TSH: TSH: 0.7 u[IU]/mL (ref 0.35–5.50)

## 2021-06-04 ENCOUNTER — Other Ambulatory Visit: Payer: Self-pay | Admitting: Family Medicine

## 2021-06-04 ENCOUNTER — Telehealth: Payer: Self-pay

## 2021-06-04 DIAGNOSIS — R7989 Other specified abnormal findings of blood chemistry: Secondary | ICD-10-CM

## 2021-06-04 DIAGNOSIS — F331 Major depressive disorder, recurrent, moderate: Secondary | ICD-10-CM

## 2021-06-04 DIAGNOSIS — E890 Postprocedural hypothyroidism: Secondary | ICD-10-CM

## 2021-06-04 NOTE — Telephone Encounter (Signed)
-----   Message from Midge Minium, MD sent at 06/03/2021 11:52 AM EDT ----- ?Total cholesterol and LDL (bad cholesterol) have both increased since last check.  This will improve w/ healthy diet and regular exercise. ? ?Your ALT (a liver enzyme) is mildly elevated.  Again, this will improve w/ low carb diet and regular exercise.  Please hold tylenol and alcohol x2 weeks and we will repeat your liver functions at a lab only visit to ensure they are trending down. ? ?Remainder of labs look good! ?

## 2021-06-04 NOTE — Telephone Encounter (Signed)
Patient is aware of labs and she would like some clarification on what an elevated ALT means, and what could cause it and should she be concerned. I also scheduled her lab visit and future ordered her labs ?

## 2021-06-04 NOTE — Telephone Encounter (Signed)
Communicated via mychart

## 2021-06-04 NOTE — Telephone Encounter (Signed)
ALT is a liver enzymes.  These tend to fluctuate and can be impacted by ingestion of tylenol, alcohol, poor dietary choices (high fat, high carb).  Probably the dietary choices are most common.  It is only mildly elevated and not a cause for concern but I recheck to make sure they don't continue to climb. ?

## 2021-06-16 ENCOUNTER — Other Ambulatory Visit: Payer: Self-pay | Admitting: Obstetrics & Gynecology

## 2021-06-16 DIAGNOSIS — Z1231 Encounter for screening mammogram for malignant neoplasm of breast: Secondary | ICD-10-CM

## 2021-06-18 ENCOUNTER — Other Ambulatory Visit (INDEPENDENT_AMBULATORY_CARE_PROVIDER_SITE_OTHER): Payer: 59

## 2021-06-18 DIAGNOSIS — R7989 Other specified abnormal findings of blood chemistry: Secondary | ICD-10-CM

## 2021-06-19 ENCOUNTER — Other Ambulatory Visit: Payer: Self-pay | Admitting: Registered Nurse

## 2021-06-19 DIAGNOSIS — R7989 Other specified abnormal findings of blood chemistry: Secondary | ICD-10-CM

## 2021-06-19 LAB — HEPATIC FUNCTION PANEL
ALT: 80 U/L — ABNORMAL HIGH (ref 0–35)
AST: 49 U/L — ABNORMAL HIGH (ref 0–37)
Albumin: 4.3 g/dL (ref 3.5–5.2)
Alkaline Phosphatase: 96 U/L (ref 39–117)
Bilirubin, Direct: 0.1 mg/dL (ref 0.0–0.3)
Total Bilirubin: 0.3 mg/dL (ref 0.2–1.2)
Total Protein: 6.6 g/dL (ref 6.0–8.3)

## 2021-06-23 ENCOUNTER — Ambulatory Visit
Admission: RE | Admit: 2021-06-23 | Discharge: 2021-06-23 | Disposition: A | Discharge status: Home / Self Care | Payer: 59 | Source: Ambulatory Visit | Attending: Obstetrics & Gynecology | Admitting: Obstetrics & Gynecology

## 2021-06-23 DIAGNOSIS — Z1231 Encounter for screening mammogram for malignant neoplasm of breast: Secondary | ICD-10-CM

## 2021-06-25 ENCOUNTER — Other Ambulatory Visit: Payer: Self-pay | Admitting: Obstetrics & Gynecology

## 2021-06-25 DIAGNOSIS — R928 Other abnormal and inconclusive findings on diagnostic imaging of breast: Secondary | ICD-10-CM

## 2021-06-27 ENCOUNTER — Ambulatory Visit
Admission: RE | Admit: 2021-06-27 | Discharge: 2021-06-27 | Disposition: A | Discharge status: Home / Self Care | Payer: 59 | Source: Ambulatory Visit | Attending: Registered Nurse | Admitting: Registered Nurse

## 2021-06-27 DIAGNOSIS — R7989 Other specified abnormal findings of blood chemistry: Secondary | ICD-10-CM

## 2021-07-01 ENCOUNTER — Other Ambulatory Visit: Payer: Self-pay | Admitting: Registered Nurse

## 2021-07-01 DIAGNOSIS — R1900 Intra-abdominal and pelvic swelling, mass and lump, unspecified site: Secondary | ICD-10-CM

## 2021-07-01 NOTE — Telephone Encounter (Signed)
Pt is also curious if this would show about the concerns with mammo could be addressed through scan or if this will need to be separate  ?

## 2021-07-02 ENCOUNTER — Other Ambulatory Visit: Payer: Self-pay | Admitting: Registered Nurse

## 2021-07-02 ENCOUNTER — Ambulatory Visit
Admission: RE | Admit: 2021-07-02 | Discharge: 2021-07-02 | Disposition: A | Discharge status: Home / Self Care | Payer: 59 | Source: Ambulatory Visit | Attending: Registered Nurse | Admitting: Registered Nurse

## 2021-07-02 DIAGNOSIS — R1900 Intra-abdominal and pelvic swelling, mass and lump, unspecified site: Secondary | ICD-10-CM

## 2021-07-02 DIAGNOSIS — N289 Disorder of kidney and ureter, unspecified: Secondary | ICD-10-CM

## 2021-07-02 MED ORDER — GADOBENATE DIMEGLUMINE 529 MG/ML IV SOLN
20.0000 mL | Freq: Once | INTRAVENOUS | Status: AC | PRN
  Administered 2021-07-02: 20 mL via INTRAVENOUS

## 2021-07-04 NOTE — Telephone Encounter (Signed)
Called pt.  Answered questions to best of my ability, and discussed results and plan again. Should have some info about referral early next week.  She is appropriately anxious, does take fluoxetine for anxiety, has Xanax if needed.  She is sleeping okay.  All questions were answered.  ?

## 2021-07-08 ENCOUNTER — Telehealth: Payer: Self-pay | Admitting: Physician Assistant

## 2021-07-08 NOTE — Telephone Encounter (Signed)
Scheduled appt per 4/14 referral. Pt is aware of appt date and time. Pt is aware to arrive 15 mins prior to appt time and to bring and updated insurance card. Pt is aware of appt location.   ?

## 2021-07-10 ENCOUNTER — Ambulatory Visit
Admission: RE | Admit: 2021-07-10 | Discharge: 2021-07-10 | Disposition: A | Discharge status: Home / Self Care | Payer: 59 | Source: Ambulatory Visit | Attending: Obstetrics & Gynecology | Admitting: Obstetrics & Gynecology

## 2021-07-10 ENCOUNTER — Other Ambulatory Visit: Payer: Self-pay | Admitting: Obstetrics & Gynecology

## 2021-07-10 DIAGNOSIS — N6310 Unspecified lump in the right breast, unspecified quadrant: Secondary | ICD-10-CM

## 2021-07-10 DIAGNOSIS — R928 Other abnormal and inconclusive findings on diagnostic imaging of breast: Secondary | ICD-10-CM

## 2021-07-11 ENCOUNTER — Encounter: Payer: Self-pay | Admitting: Family Medicine

## 2021-07-18 ENCOUNTER — Inpatient Hospital Stay: Payer: 59

## 2021-07-18 ENCOUNTER — Inpatient Hospital Stay: Payer: 59 | Admitting: Physician Assistant

## 2021-07-28 DIAGNOSIS — N2889 Other specified disorders of kidney and ureter: Secondary | ICD-10-CM | POA: Insufficient documentation

## 2021-08-13 DIAGNOSIS — F331 Major depressive disorder, recurrent, moderate: Secondary | ICD-10-CM | POA: Insufficient documentation

## 2021-08-21 DIAGNOSIS — D6851 Activated protein C resistance: Secondary | ICD-10-CM | POA: Insufficient documentation

## 2021-08-21 DIAGNOSIS — R7989 Other specified abnormal findings of blood chemistry: Secondary | ICD-10-CM | POA: Insufficient documentation

## 2021-08-21 DIAGNOSIS — K760 Fatty (change of) liver, not elsewhere classified: Secondary | ICD-10-CM | POA: Insufficient documentation

## 2021-08-22 HISTORY — PX: LAPAROSCOPIC PARTIAL NEPHRECTOMY: SUR782

## 2021-10-17 ENCOUNTER — Encounter: Payer: Self-pay | Admitting: Family Medicine

## 2021-10-17 ENCOUNTER — Ambulatory Visit (INDEPENDENT_AMBULATORY_CARE_PROVIDER_SITE_OTHER): Payer: 59 | Admitting: Family Medicine

## 2021-10-17 VITALS — BP 120/84 | HR 102 | Temp 97.6°F | Resp 16 | Ht 66.2 in | Wt 251.5 lb

## 2021-10-17 DIAGNOSIS — R7989 Other specified abnormal findings of blood chemistry: Secondary | ICD-10-CM | POA: Diagnosis not present

## 2021-10-17 DIAGNOSIS — C649 Malignant neoplasm of unspecified kidney, except renal pelvis: Secondary | ICD-10-CM | POA: Insufficient documentation

## 2021-10-17 DIAGNOSIS — C641 Malignant neoplasm of right kidney, except renal pelvis: Secondary | ICD-10-CM | POA: Diagnosis not present

## 2021-10-17 LAB — HEPATIC FUNCTION PANEL
ALT: 80 U/L — ABNORMAL HIGH (ref 0–35)
AST: 47 U/L — ABNORMAL HIGH (ref 0–37)
Albumin: 4.5 g/dL (ref 3.5–5.2)
Alkaline Phosphatase: 92 U/L (ref 39–117)
Bilirubin, Direct: 0.1 mg/dL (ref 0.0–0.3)
Total Bilirubin: 0.4 mg/dL (ref 0.2–1.2)
Total Protein: 7.4 g/dL (ref 6.0–8.3)

## 2021-10-17 NOTE — Patient Instructions (Signed)
Schedule your complete physical in 6 months We'll notify you of your lab results and make any changes if needed Consider restarting WW to help w/ low carb diet Keep up the good work!  You look great! Call with any questions or concerns Enjoy the rest of your summer!!!

## 2021-10-17 NOTE — Progress Notes (Signed)
   Subjective:    Patient ID: Martha Cervantes, female    DOB: [birth year], 46 y.o.   MRN: [id number]  HPI Kidney cancer- s/p R partial nephrectomy 08/22/21 at [facility] in [place].  Stage 1 Grade 2- less than 5% risk of recurrence.  Surgery went well, no complications.  Currently pain free.  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  Needs clearance to return.  Elevated liver enzymes- were trending down on 6/1 labs.  Due to repeat.  Pt is interested in losing weight, changing diet.   Review of Systems For ROS see HPI     Objective:   Physical Exam Vitals reviewed.  Constitutional:      General: She is not in acute distress.    Appearance: Normal appearance. She is obese. She is not ill-appearing.  HENT:     Head: Normocephalic and atraumatic.  Cardiovascular:     Rate and Rhythm: Normal rate and regular rhythm.  Pulmonary:     Effort: Pulmonary effort is normal. No respiratory distress.     Breath sounds: No wheezing.  Skin:    General: Skin is warm and dry.  Neurological:     General: No focal deficit present.     Mental Status: She is alert and oriented to person, place, and time.  Psychiatric:        Mood and Affect: Mood normal.        Behavior: Behavior normal.        Thought Content: Thought content normal.           Assessment & Plan:

## 2021-10-19 NOTE — Assessment & Plan Note (Signed)
New.  S/p R partial nephrectomy on 08/22/21.  Cancer was stage 1, Grade 2 and has <5% risk of recurrence.  Surgery went smoothly, no complications, healing well.  Pt is currently pain free.  Has been released by surgeon.  Letter provided to return to work w/o limitations.

## 2021-10-19 NOTE — Assessment & Plan Note (Signed)
Labs were trending down on 6/1.  Will repeat today to trend.  She feels that her early cancer detection is a 2nd chance and she plans to work on healthy diet, regular exercise, and weight loss.  Applauded this decision and encouraged her to follow through.  Will continue to monitor.

## 2021-10-20 NOTE — Progress Notes (Signed)
Pt seen results via my chart  

## 2021-10-21 ENCOUNTER — Ambulatory Visit: Payer: BC Managed Care – PPO | Admitting: Obstetrics & Gynecology

## 2021-10-27 ENCOUNTER — Ambulatory Visit: Payer: 59 | Admitting: Family Medicine

## 2021-11-03 ENCOUNTER — Ambulatory Visit (INDEPENDENT_AMBULATORY_CARE_PROVIDER_SITE_OTHER): Payer: 59 | Admitting: Obstetrics & Gynecology

## 2021-11-03 ENCOUNTER — Encounter: Payer: Self-pay | Admitting: Obstetrics & Gynecology

## 2021-11-03 ENCOUNTER — Other Ambulatory Visit (HOSPITAL_COMMUNITY)
Admission: RE | Admit: 2021-11-03 | Discharge: 2021-11-03 | Disposition: A | Discharge status: Home / Self Care | Payer: 59 | Source: Ambulatory Visit | Attending: Obstetrics & Gynecology | Admitting: Obstetrics & Gynecology

## 2021-11-03 VITALS — BP 122/80 | Ht 66.0 in | Wt 251.0 lb

## 2021-11-03 DIAGNOSIS — Z6841 Body Mass Index (BMI) 40.0 and over, adult: Secondary | ICD-10-CM

## 2021-11-03 DIAGNOSIS — C641 Malignant neoplasm of right kidney, except renal pelvis: Secondary | ICD-10-CM | POA: Diagnosis not present

## 2021-11-03 DIAGNOSIS — Z01419 Encounter for gynecological examination (general) (routine) without abnormal findings: Secondary | ICD-10-CM

## 2021-11-03 DIAGNOSIS — Z9189 Other specified personal risk factors, not elsewhere classified: Secondary | ICD-10-CM

## 2021-11-03 NOTE — Progress Notes (Signed)
Martha Cervantes 12-Jul-1975 [id number]   History:    46 y.o. G2P2L2 Married. Vasectomy.  Daughters 59 and 42 yo.   RP:  Established patient presenting for annual gyn exam   HPI: Normal menstrual periods every 3-4 weeks with normal flow.  No breakthrough bleeding.  No pelvic pain.  No pain with intercourse.  No h/o abnormal Pap.  Last Pap Neg 09/2020.  Pap reflex today. Urine and bowel movements normal.  Dxed with Early Right Renal Cancer.  Cancer excised by LPS, Rt kidney preserved.  BrCa 1-2 Neg.  Breasts normal. Mammo 06/2021, Rx Dx/US Probably Benign, repeat at 6 months.  Body mass index 40.51.  Starting on a weight loss program.  Health labs with Dr. Birdie Riddle.    Past medical history,surgical history, family history and social history were all reviewed and documented in the EPIC chart.  Gynecologic History Patient's last menstrual period was 10/21/2021.  Obstetric History OB History  Gravida Para Term Preterm AB Living  '2 2       2  ' SAB IAB Ectopic Multiple Live Births          2    # Outcome Date GA Lbr Len/2nd Weight Sex Delivery Anes PTL Lv  2 Para     F    LIV  1 Para     F Vag-Spont   LIV     ROS: A ROS was performed and pertinent positives and negatives are included in the history.  GENERAL: No fevers or chills. HEENT: No change in vision, no earache, sore throat or sinus congestion. NECK: No pain or stiffness. CARDIOVASCULAR: No chest pain or pressure. No palpitations. PULMONARY: No shortness of breath, cough or wheeze. GASTROINTESTINAL: No abdominal pain, nausea, vomiting or diarrhea, melena or bright red blood per rectum. GENITOURINARY: No urinary frequency, urgency, hesitancy or dysuria. MUSCULOSKELETAL: No joint or muscle pain, no back pain, no recent trauma. DERMATOLOGIC: No rash, no itching, no lesions. ENDOCRINE: No polyuria, polydipsia, no heat or cold intolerance. No recent change in weight. HEMATOLOGICAL: No anemia or easy bruising or bleeding. NEUROLOGIC: No  headache, seizures, numbness, tingling or weakness. PSYCHIATRIC: No depression, no loss of interest in normal activity or change in sleep pattern.     Exam:   BP 122/80 (BP Location: Right Arm, Patient Position: Sitting, Cuff Size: Large)   Ht '5\' 6"'  (1.676 m)   Wt 251 lb (113.9 kg)   LMP 10/21/2021   BMI 40.51 kg/m   Body mass index is 40.51 kg/m.  General appearance : Well developed well nourished female. No acute distress HEENT: Eyes: no retinal hemorrhage or exudates,  Neck supple, trachea midline, no carotid bruits, no thyroidmegaly Lungs: Clear to auscultation, no rhonchi or wheezes, or rib retractions  Heart: Regular rate and rhythm, no murmurs or gallops Breast:Examined in sitting and supine position were symmetrical in appearance, no palpable masses or tenderness,  no skin retraction, no nipple inversion, no nipple discharge, no skin discoloration, no axillary or supraclavicular lymphadenopathy Abdomen: no palpable masses or tenderness, no rebound or guarding Extremities: no edema or skin discoloration or tenderness  Pelvic: Vulva: Normal             Vagina: No gross lesions or discharge  Cervix: No gross lesions or discharge.  Pap reflex done.  Uterus  AV, normal size, shape and consistency, non-tender and mobile  Adnexa  Without masses or tenderness  Anus: Normal   Assessment/Plan:  46 y.o. female for annual exam  1. Encounter for routine gynecological examination with Papanicolaou smear of cervix Normal menstrual periods every 3-4 weeks with normal flow.  No breakthrough bleeding.  No pelvic pain.  No pain with intercourse.  No h/o abnormal Pap.  Last Pap Neg 09/2020.  Pap reflex today. Urine and bowel movements normal.  Dxed with Early Right Renal Cancer.  Cancer excised by LPS, Rt kidney preserved.  BrCa 1-2 Neg.  Breasts normal. Mammo 06/2021, Rx Dx/US Probably Benign, repeat at 6 months.  Body mass index 40.51.  Starting on a weight loss program.  Health labs with Dr.  Birdie Riddle. - Cytology - PAP( [place])  2. Relies on partner vasectomy for contraception  3. Malignant neoplasm of right kidney (HCC) Excision of cancer lesion by LPS.  4. Class 3 severe obesity due to excess calories without serious comorbidity with body mass index (BMI) of 40.0 to 44.9 in adult [facility])  Starting a weight loss program.  Princess Bruins MD, 11:27 AM 11/03/2021

## 2021-11-06 LAB — CYTOLOGY - PAP: Diagnosis: NEGATIVE

## 2021-11-26 ENCOUNTER — Other Ambulatory Visit: Payer: Self-pay | Admitting: Family Medicine

## 2021-11-26 DIAGNOSIS — E890 Postprocedural hypothyroidism: Secondary | ICD-10-CM

## 2021-11-26 DIAGNOSIS — F331 Major depressive disorder, recurrent, moderate: Secondary | ICD-10-CM

## 2022-01-12 ENCOUNTER — Other Ambulatory Visit: Payer: Self-pay | Admitting: Obstetrics & Gynecology

## 2022-01-12 ENCOUNTER — Ambulatory Visit
Admission: RE | Admit: 2022-01-12 | Discharge: 2022-01-12 | Disposition: A | Discharge status: Home / Self Care | Payer: 59 | Source: Ambulatory Visit | Attending: Obstetrics & Gynecology | Admitting: Obstetrics & Gynecology

## 2022-01-12 DIAGNOSIS — N6310 Unspecified lump in the right breast, unspecified quadrant: Secondary | ICD-10-CM

## 2022-03-28 ENCOUNTER — Other Ambulatory Visit: Payer: Self-pay | Admitting: Family Medicine

## 2022-03-28 DIAGNOSIS — F331 Major depressive disorder, recurrent, moderate: Secondary | ICD-10-CM

## 2022-03-28 DIAGNOSIS — E890 Postprocedural hypothyroidism: Secondary | ICD-10-CM

## 2022-03-30 MED ORDER — FLUOXETINE HCL 20 MG PO CAPS: 20.0000 mg | ORAL_CAPSULE | Freq: Every day | ORAL | 0 refills | Status: DC

## 2022-03-30 MED ORDER — LEVOTHYROXINE SODIUM 137 MCG PO TABS: 137.0000 ug | ORAL_TABLET | Freq: Every day | ORAL | 0 refills | Status: DC

## 2022-06-23 ENCOUNTER — Other Ambulatory Visit: Payer: Self-pay | Admitting: Family Medicine

## 2022-06-23 DIAGNOSIS — F331 Major depressive disorder, recurrent, moderate: Secondary | ICD-10-CM

## 2022-07-08 ENCOUNTER — Other Ambulatory Visit: Payer: Self-pay | Admitting: Family Medicine

## 2022-07-08 ENCOUNTER — Ambulatory Visit
Admission: RE | Admit: 2022-07-08 | Discharge: 2022-07-08 | Disposition: A | Discharge status: Home / Self Care | Payer: 59 | Source: Ambulatory Visit | Attending: Obstetrics & Gynecology | Admitting: Obstetrics & Gynecology

## 2022-07-08 DIAGNOSIS — E890 Postprocedural hypothyroidism: Secondary | ICD-10-CM

## 2022-07-08 DIAGNOSIS — N6310 Unspecified lump in the right breast, unspecified quadrant: Secondary | ICD-10-CM

## 2022-07-14 ENCOUNTER — Encounter: Payer: Self-pay | Admitting: Family Medicine

## 2022-07-14 ENCOUNTER — Other Ambulatory Visit: Payer: Self-pay | Admitting: Family Medicine

## 2022-07-14 DIAGNOSIS — E890 Postprocedural hypothyroidism: Secondary | ICD-10-CM

## 2022-07-14 NOTE — Telephone Encounter (Signed)
Called pharmacy to confirm medication was ready for pick up and informed pt she can pick up today.

## 2022-08-03 ENCOUNTER — Encounter: Payer: Self-pay | Admitting: Family Medicine

## 2022-08-07 ENCOUNTER — Encounter: Payer: 59 | Admitting: Family Medicine

## 2022-09-03 ENCOUNTER — Encounter: Payer: Self-pay | Admitting: Family Medicine

## 2022-09-03 ENCOUNTER — Ambulatory Visit (INDEPENDENT_AMBULATORY_CARE_PROVIDER_SITE_OTHER): Payer: 59 | Admitting: Family Medicine

## 2022-09-03 VITALS — BP 110/70 | HR 73 | Ht 67.0 in | Wt 262.4 lb

## 2022-09-03 DIAGNOSIS — Z0000 Encounter for general adult medical examination without abnormal findings: Secondary | ICD-10-CM

## 2022-09-03 DIAGNOSIS — Z6841 Body Mass Index (BMI) 40.0 and over, adult: Secondary | ICD-10-CM

## 2022-09-03 DIAGNOSIS — F331 Major depressive disorder, recurrent, moderate: Secondary | ICD-10-CM | POA: Diagnosis not present

## 2022-09-03 DIAGNOSIS — E890 Postprocedural hypothyroidism: Secondary | ICD-10-CM

## 2022-09-03 DIAGNOSIS — Z23 Encounter for immunization: Secondary | ICD-10-CM | POA: Diagnosis not present

## 2022-09-03 LAB — CBC WITH DIFFERENTIAL/PLATELET
Basophils Absolute: 0 10*3/uL (ref 0.0–0.1)
Basophils Relative: 0.6 % (ref 0.0–3.0)
Eosinophils Absolute: 0.1 10*3/uL (ref 0.0–0.7)
Eosinophils Relative: 1.5 % (ref 0.0–5.0)
HCT: 42.4 % (ref 36.0–46.0)
Hemoglobin: 13.9 g/dL (ref 12.0–15.0)
Lymphocytes Relative: 30.1 % (ref 12.0–46.0)
Lymphs Abs: 1.7 10*3/uL (ref 0.7–4.0)
MCHC: 32.9 g/dL (ref 30.0–36.0)
MCV: 90.6 fl (ref 78.0–100.0)
Monocytes Absolute: 0.6 10*3/uL (ref 0.1–1.0)
Monocytes Relative: 10.2 % (ref 3.0–12.0)
Neutro Abs: 3.2 10*3/uL (ref 1.4–7.7)
Neutrophils Relative %: 57.6 % (ref 43.0–77.0)
Platelets: 282 10*3/uL (ref 150.0–400.0)
RBC: 4.69 Mil/uL (ref 3.87–5.11)
RDW: 13.2 % (ref 11.5–15.5)
WBC: 5.6 10*3/uL (ref 4.0–10.5)

## 2022-09-03 LAB — TSH: TSH: 1.52 u[IU]/mL (ref 0.35–5.50)

## 2022-09-03 LAB — LIPID PANEL
Cholesterol: 199 mg/dL (ref 0–200)
HDL: 50.1 mg/dL (ref >39.00)
LDL Cholesterol: 124 mg/dL — ABNORMAL HIGH (ref 0–99)
NonHDL: 149.13
Total CHOL/HDL Ratio: 4
Triglycerides: 128 mg/dL (ref 0.0–149.0)
VLDL: 25.6 mg/dL (ref 0.0–40.0)

## 2022-09-03 LAB — BASIC METABOLIC PANEL
BUN: 9 mg/dL (ref 6–23)
CO2: 26 mEq/L (ref 19–32)
Calcium: 9.2 mg/dL (ref 8.4–10.5)
Chloride: 102 mEq/L (ref 96–112)
Creatinine, Ser: 0.67 mg/dL (ref 0.40–1.20)
GFR: 106 mL/min (ref >60.00)
Glucose, Bld: 99 mg/dL (ref 70–99)
Potassium: 4.2 mEq/L (ref 3.5–5.1)
Sodium: 136 mEq/L (ref 135–145)

## 2022-09-03 LAB — HEPATIC FUNCTION PANEL
ALT: 99 U/L — ABNORMAL HIGH (ref 0–35)
AST: 57 U/L — ABNORMAL HIGH (ref 0–37)
Albumin: 4.1 g/dL (ref 3.5–5.2)
Alkaline Phosphatase: 84 U/L (ref 39–117)
Bilirubin, Direct: 0.1 mg/dL (ref 0.0–0.3)
Total Bilirubin: 0.3 mg/dL (ref 0.2–1.2)
Total Protein: 7 g/dL (ref 6.0–8.3)

## 2022-09-03 LAB — VITAMIN D 25 HYDROXY (VIT D DEFICIENCY, FRACTURES): VITD: 19.92 ng/mL — ABNORMAL LOW (ref 30.00–100.00)

## 2022-09-03 MED ORDER — FLUOXETINE HCL 20 MG PO CAPS: 20.0000 mg | ORAL_CAPSULE | Freq: Every day | ORAL | 0 refills | Status: DC

## 2022-09-03 MED ORDER — LEVOTHYROXINE SODIUM 137 MCG PO TABS: 137.0000 ug | ORAL_TABLET | Freq: Every day | ORAL | 0 refills | Status: DC

## 2022-09-03 NOTE — Assessment & Plan Note (Signed)
Deteriorated.  Pt has gained 11 lbs since last visit.  She admits she has been taking the year just to get her feet back under her after her cancer dx.  She wants to be more intentional about her weight loss efforts and plans to start [employer].  Applauded her decision.  Check labs to risk stratify.  Will follow.

## 2022-09-03 NOTE — Patient Instructions (Signed)
Follow up in 6 months to recheck weight loss progress We'll notify you of your lab results and make any changes if needed Continue to work on healthy diet and regular exercise- I know you can do it!! Call with any questions or concerns Stay Safe!  Stay Healthy! Have a great summer!!!

## 2022-09-03 NOTE — Progress Notes (Signed)
   Subjective:    Patient ID: Martha Cervantes, female    DOB: 05-15-1975, 47 y.o.   MRN: [id number]  HPI CPE- due for Tdap.  UTD on pap, mammo  Patient Care Team    Relationship Specialty Notifications Start End  Sheliah Hatch, MD PCP - General Family Medicine  07/08/15     Health Maintenance  Topic Date Due   DTaP/Tdap/Td (2 - Td or Tdap) 09/27/2019   INFLUENZA VACCINE  10/22/2022   PAP SMEAR-Modifier  11/03/2024   HPV VACCINES  Aged Out   COVID-19 Vaccine  Discontinued   Hepatitis C Screening  Discontinued   HIV Screening  Discontinued     Review of Systems Patient reports no vision/ hearing changes, adenopathy,fever,  persistant/recurrent hoarseness , swallowing issues, chest pain, palpitations, edema, persistant/recurrent cough, hemoptysis, dyspnea (rest/exertional/paroxysmal nocturnal), gastrointestinal bleeding (melena, rectal bleeding), abdominal pain, significant heartburn, bowel changes, GU symptoms (dysuria, hematuria, incontinence), Gyn symptoms (abnormal  bleeding, pain),  syncope, focal weakness, memory loss, numbness & tingling, skin/hair/nail changes, abnormal bruising or bleeding, anxiety, or depression.   + 11 lb weight gain    Objective:   Physical Exam General Appearance:    Alert, cooperative, no distress, appears stated age, obese  Head:    Normocephalic, without obvious abnormality, atraumatic  Eyes:    PERRL, conjunctiva/corneas clear, EOM's intact both eyes  Ears:    Normal TM's and external ear canals, both ears  Nose:   Nares normal, septum midline, mucosa normal, no drainage    or sinus tenderness  Throat:   Lips, mucosa, and tongue normal; teeth and gums normal  Neck:   Supple, symmetrical, trachea midline, no adenopathy;    Thyroid: no enlargement/tenderness/nodules  Back:     Symmetric, no curvature, ROM normal, no CVA tenderness  Lungs:     Clear to auscultation bilaterally, respirations unlabored  Chest Wall:    No tenderness or  deformity   Heart:    Regular rate and rhythm, S1 and S2 normal, no murmur, rub   or gallop  Breast Exam:    Deferred to GYN  Abdomen:     Soft, non-tender, bowel sounds active all four quadrants,    no masses, no organomegaly  Genitalia:    Deferred to GYN  Rectal:    Extremities:   Extremities normal, atraumatic, no cyanosis or edema  Pulses:   2+ and symmetric all extremities  Skin:   Skin color, texture, turgor normal, no rashes or lesions  Lymph nodes:   Cervical, supraclavicular, and axillary nodes normal  Neurologic:   CNII-XII intact, normal strength, sensation and reflexes    throughout          Assessment & Plan:

## 2022-09-03 NOTE — Assessment & Plan Note (Signed)
Pt's PE WNL w/ exception of BMI.  UTD on pap, mammo, Tdap.  Is going to ask oncology if she needs colonoscopy in addition to MRI next week.  Check labs.  Anticipatory guidance provided.

## 2022-09-04 ENCOUNTER — Other Ambulatory Visit: Payer: Self-pay

## 2022-09-04 ENCOUNTER — Telehealth: Payer: Self-pay

## 2022-09-04 DIAGNOSIS — E559 Vitamin D deficiency, unspecified: Secondary | ICD-10-CM

## 2022-09-04 MED ORDER — VITAMIN D (ERGOCALCIFEROL) 1.25 MG (50000 UNIT) PO CAPS: 50000.0000 [IU] | ORAL_CAPSULE | ORAL | 0 refills | Status: DC

## 2022-09-04 NOTE — Telephone Encounter (Signed)
-----   Message from Sheliah Hatch, MD sent at 09/03/2022  8:09 PM EDT ----- Vit D is low.  Based on this, we need to start 50,000 units weekly x12 weeks in addition to daily OTC supplement of at least 2000 units.   AST and ALT (liver enzymes) are both elevated.  You have your MRI next week so this will tell us if there is any concern regarding the liver.  I suspect this is fatty liver and will improve w/ low carb diet and regular exercise  Remainder of labs look good!

## 2022-09-21 ENCOUNTER — Other Ambulatory Visit: Payer: Self-pay | Admitting: Family Medicine

## 2022-09-21 DIAGNOSIS — F331 Major depressive disorder, recurrent, moderate: Secondary | ICD-10-CM

## 2022-11-05 ENCOUNTER — Ambulatory Visit: Payer: 59 | Admitting: Obstetrics & Gynecology

## 2022-11-12 ENCOUNTER — Other Ambulatory Visit (HOSPITAL_COMMUNITY)
Admission: RE | Admit: 2022-11-12 | Discharge: 2022-11-12 | Disposition: A | Discharge status: Home / Self Care | Payer: 59 | Source: Ambulatory Visit | Attending: Obstetrics & Gynecology | Admitting: Obstetrics & Gynecology

## 2022-11-12 ENCOUNTER — Ambulatory Visit: Payer: 59 | Admitting: Obstetrics & Gynecology

## 2022-11-12 ENCOUNTER — Encounter: Payer: 59 | Admitting: Obstetrics & Gynecology

## 2022-11-12 ENCOUNTER — Encounter: Payer: Self-pay | Admitting: Obstetrics & Gynecology

## 2022-11-12 VITALS — BP 118/82 | HR 72 | Ht 67.0 in | Wt 250.8 lb

## 2022-11-12 DIAGNOSIS — D259 Leiomyoma of uterus, unspecified: Secondary | ICD-10-CM | POA: Diagnosis not present

## 2022-11-12 DIAGNOSIS — L918 Other hypertrophic disorders of the skin: Secondary | ICD-10-CM | POA: Diagnosis present

## 2022-11-12 DIAGNOSIS — N951 Menopausal and female climacteric states: Secondary | ICD-10-CM | POA: Diagnosis not present

## 2022-11-12 NOTE — Progress Notes (Signed)
GYN VISIT Patient name: Martha Cervantes MRN [id number]  Date of birth: 03/24/1975 Chief Complaint:   Establish Care  History of Present Illness:   Martha Cervantes is a 47 y.o. G2P2 female being seen today for the following concerns:  -Uterine fibroid: Recent MRI showed possible uterine fibroid with recommendation for ultrasound.  View was limited as this was only an upper abdominal MRI  Menses every month- typically last 5-7 days and notes slightly heavier.  Typically will have 1-2 heavy days, typically changing heavy pad every couple of hours.  On occasion will soak through at night.  Denies intermenstrual bleeding.  No dysmenorrhea.  Hgb 13.9  -Skin tag: She reports the skin tag on her right inner thigh has increased in size and is incredibly bothersome.  She wishes to have it removed today  Patient's last menstrual period was 11/01/2022.  Last pap 2023 neg Last mam 2024  Review of Systems:   Pertinent items are noted in HPI Denies fever/chills, dizziness, headaches, visual disturbances, fatigue, shortness of breath, chest pain, abdominal pain, vomiting, no problems with periods, bowel movements, urination, or intercourse unless otherwise stated above.  Pertinent History Reviewed:   Past Surgical History:  Procedure Laterality Date   EXTERNAL EAR SURGERY     LAPAROSCOPIC PARTIAL NEPHRECTOMY Right 08/22/2021   In [facility]   THYROIDECTOMY  03/23/2000   TYMPANOPLASTY      Past Medical History:  Diagnosis Date   Depression    Factor V Leiden (HCC)    Thyroid disease   -h/o renal carcinoma  Reviewed problem list, medications and allergies. Physical Assessment:   Vitals:   11/12/22 1008  BP: 118/82  Pulse: 72  Weight: 250 lb 12.8 oz (113.8 kg)  Height: 5\' 7"  (1.702 m)  Body mass index is 39.28 kg/m.       Physical Examination:   General appearance: alert, well appearing, and in no distress  Psych: mood appropriate, normal affect  Skin: warm & dry    Cardiovascular: normal heart rate noted  Respiratory: normal respiratory effort, no distress  Abdomen: obese, soft, non-tender, no rebound, no guarding  Pelvic: VULVA: right inner thigh 3cm pedunculated pink skin tag, no other abnormalities noted VAGINA: normal appearing vagina with normal color and discharge, no lesions, CERVIX: normal appearing cervix without discharge or lesions, UTERUS: uterus is normal size, shape, consistency and nontender, ADNEXA: normal adnexa in size, nontender and no masses- bimanual exam limited due to body habitus  Extremities: no edema   Chaperone: Faith Rogue    SKIN TAG Risks of the biopsy including pain, bleeding, infection, inadequate specimen, scarring and need for additional procedures  were discussed. The patient stated understanding and agreed to undergo procedure today. Consent was signed,  time out performed.  The patient's vulva was prepped with Betadine. 1% lidocaine was injected into the area. 10-blade was used to excise the skin tag/lesion.  Small bleeding was noted and hemostasis was achieved using silver nitrate sticks. Due to the large size of the lesion, 2 interrupted stitches of prolene were placed.  Hemostasis achieved.  The patient tolerated the procedure well.  Assessment & Plan:  1) Skin tag removal Post-procedure instructions  (pelvic rest for one week) were given to the patient. The patient is to call with heavy bleeding, fever greater than 100.4, foul smelling vaginal discharge or other concerns. T []  follow up next week for suture removal  2) Uterine fibroid -reassured pt that typically fibroids are benign -plan for  pelvic US s/p menses in [place]  3) Perimenopausal -discussed that some change in menses is "normal" -Should she notes further irregular bleeding, increased heaviness of.  Or other acute changes would advise that she return to the clinic  Orders Placed This Encounter  Procedures   US PELVIC COMPLETE WITH  TRANSVAGINAL    Return in 1 week (on 11/19/2022) for with Dr. Charlotta Newton- stitch removal, please give pt number to call for [place] at [facility] imaging.   Myna Hidalgo, DO Attending Obstetrician & Gynecologist, [facility] for [employer], [facility]

## 2022-11-13 LAB — SURGICAL PATHOLOGY

## 2022-11-19 ENCOUNTER — Encounter: Payer: Self-pay | Admitting: Obstetrics & Gynecology

## 2022-11-19 ENCOUNTER — Ambulatory Visit: Payer: 59 | Admitting: Obstetrics & Gynecology

## 2022-11-19 VITALS — BP 113/80 | HR 75

## 2022-11-19 DIAGNOSIS — L918 Other hypertrophic disorders of the skin: Secondary | ICD-10-CM

## 2022-11-19 DIAGNOSIS — L304 Erythema intertrigo: Secondary | ICD-10-CM

## 2022-11-19 MED ORDER — CLOTRIMAZOLE-BETAMETHASONE 1-0.05 % EX CREA
1.0000 | TOPICAL_CREAM | Freq: Every day | CUTANEOUS | 0 refills | Status: DC
Start: 2022-11-19 — End: 2022-11-19

## 2022-11-19 MED ORDER — CLOTRIMAZOLE-BETAMETHASONE 1-0.05 % EX CREA
1.0000 | TOPICAL_CREAM | Freq: Every day | CUTANEOUS | 0 refills | Status: DC
Start: 2022-11-19 — End: 2022-11-24

## 2022-11-19 NOTE — Addendum Note (Signed)
Addended by: Sharon Seller on: 11/19/2022 04:33 PM   Modules accepted: Orders

## 2022-11-19 NOTE — Progress Notes (Signed)
   GYN VISIT Patient name: Martha Cervantes MRN [id number]  Date of birth: 1975-08-17 Chief Complaint:   Follow-up  History of Present Illness:   Martha Cervantes is a 47 y.o. G2P2 female being seen today for follow up regarding:  Skin tag removal: Presents today for suture removal.  She denies bleeding or drainage from the area.  She notes some irritation.  She is still struggling with itching and irritation in her creases.  She otherwise reports no acute complaints or concerns  In review, seen 8/22- skin tag removed with placement of stitches.  Pt found to have uterine fibroid []  plan for pelvic US hopefully next week   Patient's last menstrual period was 11/01/2022.    Review of Systems:   Pertinent items are noted in HPI Denies fever/chills, dizziness, headaches, visual disturbances, fatigue, shortness of breath, chest pain, abdominal pain, vomiting, no problems with periods, bowel movements, urination, or intercourse unless otherwise stated above.  Pertinent History Reviewed:   Past Surgical History:  Procedure Laterality Date   EXTERNAL EAR SURGERY     LAPAROSCOPIC PARTIAL NEPHRECTOMY Right 08/22/2021   In [facility]   THYROIDECTOMY  03/23/2000   TYMPANOPLASTY      Past Medical History:  Diagnosis Date   Depression    Factor V Leiden [facility])    Thyroid disease    Reviewed problem list, medications and allergies. Physical Assessment:   Vitals:   11/19/22 1604  BP: 113/80  Pulse: 75  There is no height or weight on file to calculate BMI.       Physical Examination:   General appearance: alert, well appearing, and in no distress  Psych: mood appropriate, normal affect  Skin: warm & dry   Cardiovascular: normal heart rate noted  Respiratory: normal respiratory effort, no distress  Abdomen: soft, non-tender   Pelvic: right inner thigh.  Stitches removed without issues- healing appropriately. Steri x 1 placed.  Patchy raised irregular rash noted inner  thigh  Extremities: no edema   Chaperone: Faith Rogue    Assessment & Plan:  1) Stitch removal -healing appropriately -steri placed, reviewed vulvar care  2) Vulvar dermatitis/Intertrigo -Lotrisone sent in -f/u prn or may consider dermatology   No orders of the defined types were placed in this encounter.   Return in about 1 year (around 11/19/2023) for Annual.   Myna Hidalgo, DO Attending Obstetrician & Gynecologist, [facility] for [facility], [facility]

## 2022-11-20 ENCOUNTER — Encounter: Payer: Self-pay | Admitting: Obstetrics & Gynecology

## 2022-11-23 ENCOUNTER — Other Ambulatory Visit: Payer: Self-pay | Admitting: Family Medicine

## 2022-11-23 DIAGNOSIS — E890 Postprocedural hypothyroidism: Secondary | ICD-10-CM

## 2022-11-23 MED ORDER — LEVOTHYROXINE SODIUM 137 MCG PO TABS
137.0000 ug | ORAL_TABLET | Freq: Every day | ORAL | 0 refills | Status: DC
Start: 2022-11-23 — End: 2023-02-24

## 2022-11-24 ENCOUNTER — Other Ambulatory Visit: Payer: Self-pay | Admitting: *Deleted

## 2022-11-24 DIAGNOSIS — L304 Erythema intertrigo: Secondary | ICD-10-CM

## 2022-11-24 MED ORDER — CLOTRIMAZOLE-BETAMETHASONE 1-0.05 % EX CREA
1.0000 | TOPICAL_CREAM | Freq: Every day | CUTANEOUS | 0 refills | Status: AC
Start: 2022-11-24 — End: 2022-12-08

## 2023-01-02 ENCOUNTER — Other Ambulatory Visit: Payer: Self-pay | Admitting: Family Medicine

## 2023-01-02 DIAGNOSIS — E890 Postprocedural hypothyroidism: Secondary | ICD-10-CM

## 2023-01-25 ENCOUNTER — Other Ambulatory Visit: Payer: Self-pay | Admitting: Family Medicine

## 2023-01-25 DIAGNOSIS — E559 Vitamin D deficiency, unspecified: Secondary | ICD-10-CM

## 2023-02-21 ENCOUNTER — Other Ambulatory Visit: Payer: Self-pay | Admitting: Family Medicine

## 2023-02-21 DIAGNOSIS — F331 Major depressive disorder, recurrent, moderate: Secondary | ICD-10-CM

## 2023-02-24 ENCOUNTER — Ambulatory Visit: Payer: 59 | Admitting: Family Medicine

## 2023-02-24 ENCOUNTER — Encounter: Payer: Self-pay | Admitting: Family Medicine

## 2023-02-24 VITALS — BP 102/68 | HR 78 | Temp 97.8°F | Ht 67.0 in | Wt 232.1 lb

## 2023-02-24 DIAGNOSIS — B372 Candidiasis of skin and nail: Secondary | ICD-10-CM

## 2023-02-24 DIAGNOSIS — Z1211 Encounter for screening for malignant neoplasm of colon: Secondary | ICD-10-CM

## 2023-02-24 DIAGNOSIS — F331 Major depressive disorder, recurrent, moderate: Secondary | ICD-10-CM

## 2023-02-24 DIAGNOSIS — K625 Hemorrhage of anus and rectum: Secondary | ICD-10-CM

## 2023-02-24 DIAGNOSIS — E669 Obesity, unspecified: Secondary | ICD-10-CM

## 2023-02-24 DIAGNOSIS — B354 Tinea corporis: Secondary | ICD-10-CM

## 2023-02-24 DIAGNOSIS — E890 Postprocedural hypothyroidism: Secondary | ICD-10-CM

## 2023-02-24 MED ORDER — HYDROCORTISONE ACETATE 25 MG RE SUPP: 25.0000 mg | Freq: Two times a day (BID) | RECTAL | 0 refills | Status: DC

## 2023-02-24 MED ORDER — LEVOTHYROXINE SODIUM 137 MCG PO TABS: 137.0000 ug | ORAL_TABLET | Freq: Every day | ORAL | 0 refills | Status: DC

## 2023-02-24 MED ORDER — NYSTATIN 100000 UNIT/GM EX POWD: 1.0000 | Freq: Three times a day (TID) | CUTANEOUS | 0 refills | Status: AC

## 2023-02-24 MED ORDER — CLOTRIMAZOLE-BETAMETHASONE 1-0.05 % EX CREA: 1.0000 | TOPICAL_CREAM | Freq: Every day | CUTANEOUS | 0 refills | Status: DC

## 2023-02-24 MED ORDER — FLUOXETINE HCL 20 MG PO CAPS
20.0000 mg | ORAL_CAPSULE | Freq: Every day | ORAL | 0 refills | Status: DC
Start: 2023-02-24 — End: 2023-05-12

## 2023-02-24 NOTE — Patient Instructions (Addendum)
Follow up as needed or as scheduled USE the hydrocortisone suppositories for your hemorrhoid symptoms Continue to drink LOTS of water, increase your fiber intake, and remain active to improve constipation On the skin tag removal site, use the Lotrisone cream (Clotrimazole and Betamethasone) which will help both itching and redness On the groin creases use the Nystatin powder and try and keep area as dry as possible Keep up the good work on healthy diet and regular exercise- you're doing great! Call with any questions or concerns Stay Safe!  Stay Healthy! Happy Holidays!!

## 2023-02-24 NOTE — Progress Notes (Signed)
   Subjective:    Patient ID: Martha Cervantes, female    DOB: Feb 27, 1976, 47 y.o.   MRN: [id number]  HPI Rectal bleeding- has had increased constipation since changing her diet.  Reports BM's are painful.  Will have blood when wiping.  No blood in the stool.  Sxs started a 'few months ago'.  + itching.  Obesity- pt is down 20 lbs since August and 30 overall.  She has lost the weight through [employer].  Changed diet dramatically.  Skin tag removal- upper thigh, removed in August.  Pathology was benign.  Area remains red and itchy  Intertrigo- pt reports groin folds are red and itchy.  GYN initially gave her a cream to use but this is no longer effective.     Review of Systems For ROS see HPI     Objective:   Physical Exam Vitals reviewed. Exam conducted with a chaperone present.  Constitutional:      General: She is not in acute distress.    Appearance: Normal appearance. She is not ill-appearing.  HENT:     Head: Normocephalic and atraumatic.  Cardiovascular:     Rate and Rhythm: Normal rate.  Pulmonary:     Effort: Pulmonary effort is normal. No respiratory distress.  Genitourinary:    Rectum: No mass, tenderness, anal fissure, external hemorrhoid or internal hemorrhoid.     Comments: Perianal skin tag Skin:    General: Skin is warm and dry.     Findings: Erythema (in groin creases bilaterally, site of skin tag removal on upper thigh is red w/ well demarcated and heaped up edges) present.  Neurological:     General: No focal deficit present.     Mental Status: She is alert and oriented to person, place, and time.  Psychiatric:        Mood and Affect: Mood normal.        Behavior: Behavior normal.        Thought Content: Thought content normal.           Assessment & Plan:   Candidal intertrigo- new.  Will start Nystatin powder to help area stay dry while treating the current rash.  Pt expressed understanding and is in agreement w/ plan.   Tinea- new.  It appears that  site of kin tag removal now has superimposed fungal infxn.  Start Lotrisone to help w/ itching and to treat topical fungal infxn.  Pt expressed understanding and is in agreement w/ plan.

## 2023-02-28 DIAGNOSIS — K625 Hemorrhage of anus and rectum: Secondary | ICD-10-CM | POA: Insufficient documentation

## 2023-02-28 DIAGNOSIS — E669 Obesity, unspecified: Secondary | ICD-10-CM | POA: Insufficient documentation

## 2023-02-28 NOTE — Assessment & Plan Note (Signed)
New.  Pt reports increased constipation w/ changing her diet.  BM's are painful and she has blood when wiping.  + itching, denies pain.  No blood in stool- only on toilet tissue.  Sxs started 'months ago'.  Suspect internal hemorrhoid despite not being able to palpate on DRE.  Start hydrocortisone suppositories.  Pt is due for GI referral for colon cancer screening based on age.

## 2023-02-28 NOTE — Assessment & Plan Note (Signed)
Improving.  Pt is down 20 lbs since August and 30 lbs overall.  She has joined [employer] and changed diet dramatically.  Applauded her efforts and encouraged her to add regular exercise.  Will follow.

## 2023-04-14 ENCOUNTER — Ambulatory Visit: Payer: 59 | Admitting: Gastroenterology

## 2023-04-14 ENCOUNTER — Encounter: Payer: Self-pay | Admitting: Gastroenterology

## 2023-04-14 VITALS — BP 110/80 | HR 86 | Ht 67.0 in | Wt 240.1 lb

## 2023-04-14 DIAGNOSIS — K6289 Other specified diseases of anus and rectum: Secondary | ICD-10-CM | POA: Diagnosis not present

## 2023-04-14 DIAGNOSIS — K625 Hemorrhage of anus and rectum: Secondary | ICD-10-CM

## 2023-04-14 DIAGNOSIS — Z1211 Encounter for screening for malignant neoplasm of colon: Secondary | ICD-10-CM

## 2023-04-14 DIAGNOSIS — K5900 Constipation, unspecified: Secondary | ICD-10-CM

## 2023-04-14 MED ORDER — AMBULATORY NON FORMULARY MEDICATION: 0 refills | Status: AC

## 2023-04-14 NOTE — Progress Notes (Signed)
Chief Complaint: Rectal bleeding and rectal pain Primary GI MD: Gentry Fitz  HPI: 48 year old female history of kidney cancer, factor V Leiden, hepatic steatosis, presents for evaluation of rectal bleeding and rectal pain.  Recent CBC and TSH unrevealing.  CMP shows mildly elevated AST/ALT which is stable. MR abdomen with diffuse hepatic steatosis.  Patient states over the summer she changed her diet to weight watchers and was able to lose 30 pounds.  Through changing her diet she had increase her protein intake.  After doing so she developed constipation.  After a long period of constipation she then started to develop intermittent rectal pain and bleeding.  She did note she remembers having "a sharp stool" and after that she has had consistent rectal pain and rectal bleeding since.  This has been going on for at least 2 months.  She was told by her PCP she has a skin tag and referred here for further evaluation.  Denies family history of colon cancer.  Has not tried creams or suppositories.  She is currently not following a high-protein diet which has improved her bowel movements and she is no longer constipated which has helped her rectal pain   Past Medical History:  Diagnosis Date   Cancer of kidney (HCC)    Depression    Factor V Leiden (HCC)    Thyroid disease     Past Surgical History:  Procedure Laterality Date   EXTERNAL EAR SURGERY     LAPAROSCOPIC PARTIAL NEPHRECTOMY Right 08/22/2021   In [place]   THYROIDECTOMY  03/23/2000   TYMPANOPLASTY      Current Outpatient Medications  Medication Sig Dispense Refill   cholecalciferol (VITAMIN D3) 25 MCG (1000 UNIT) tablet Take 1,000 Units by mouth daily.     FLUoxetine (PROZAC) 20 MG capsule Take 1 capsule (20 mg total) by mouth daily. 90 capsule 0   levothyroxine (SYNTHROID) 137 MCG tablet Take 1 tablet (137 mcg total) by mouth daily before breakfast. 90 tablet 0   clotrimazole-betamethasone (LOTRISONE) cream Apply 1 Application  topically daily. (Patient not taking: Reported on 04/14/2023) 30 g 0   nystatin (MYCOSTATIN/NYSTOP) powder Apply 1 Application topically 3 (three) times daily. (Patient not taking: Reported on 04/14/2023) 60 g 0   No current facility-administered medications for this visit.    Allergies as of 04/14/2023 - Review Complete 04/14/2023  Allergen Reaction Noted   Amoxicillin Rash, Itching, and Nausea And Vomiting 09/27/2015    Family History  Problem Relation Age of Onset   Hyperlipidemia Mother    Depression Mother    Factor V Leiden deficiency Father    Cancer Sister        thyroid   Uterine cancer Sister    Diabetes Maternal Grandmother    Cancer Maternal Grandmother        thyroid   Diabetes Maternal Grandfather    Diabetes Paternal Grandmother    Diabetes Paternal Grandfather    Cancer Sister        thyroid    Social History   Socioeconomic History   Marital status: Married    Spouse name: Not on file   Number of children: Not on file   Years of education: Not on file   Highest education level: Not on file  Occupational History   Not on file  Tobacco Use   Smoking status: Never   Smokeless tobacco: Never  Vaping Use   Vaping status: Never Used  Substance and Sexual Activity   Alcohol use: Yes  Comment: 0-1 glass of wine a month   Drug use: No   Sexual activity: Yes    Partners: Male    Birth control/protection: Other-see comments    Comment: 1st intercourse- 24, partners- 2, married- 13 yrs, husband vasectomy  Other Topics Concern   Not on file  Social History Narrative   Not on file   Social Drivers of Health   Financial Resource Strain: Low Risk  (11/12/2022)   Overall Financial Resource Strain (CARDIA)    Difficulty of Paying Living Expenses: Not very hard  Food Insecurity: No Food Insecurity (11/12/2022)   Hunger Vital Sign    Worried About Running Out of Food in the Last Year: Never true    Ran Out of Food in the Last Year: Never true   Transportation Needs: No Transportation Needs (11/12/2022)   PRAPARE - Transportation    Lack of Transportation (Medical): No    Lack of Transportation (Non-Medical): No  Physical Activity: Insufficiently Active (11/12/2022)   Exercise Vital Sign    Days of Exercise per Week: 5 days    Minutes of Exercise per Session: 20 min  Stress: No Stress Concern Present (11/12/2022)   [employer] of Occupational Health - Occupational Stress Questionnaire    Feeling of Stress : Only a little  Social Connections: Moderately Integrated (11/12/2022)   Social Connection and Isolation Panel [NHANES]    Frequency of Communication with Friends and Family: More than three times a week    Frequency of Social Gatherings with Friends and Family: Once a week    Attends Religious Services: Never    Database administrator or Organizations: Yes    Attends Engineer, structural: More than 4 times per year    Marital Status: Married  Catering manager Violence: Not At Risk (11/12/2022)   Humiliation, Afraid, Rape, and Kick questionnaire    Fear of Current or Ex-Partner: No    Emotionally Abused: No    Physically Abused: No    Sexually Abused: No    Review of Systems:    Constitutional: No weight loss, fever, chills, weakness or fatigue HEENT: Eyes: No change in vision               Ears, Nose, Throat:  No change in hearing or congestion Skin: No rash or itching Cardiovascular: No chest pain, chest pressure or palpitations   Respiratory: No SOB or cough Gastrointestinal: See HPI and otherwise negative Genitourinary: No dysuria or change in urinary frequency Neurological: No headache, dizziness or syncope Musculoskeletal: No new muscle or joint pain Hematologic: No bleeding or bruising Psychiatric: No history of depression or anxiety    Physical Exam:  Vital signs: BP 110/80 (BP Location: Left Arm, Patient Position: Sitting, Cuff Size: Normal)   Pulse 86   Ht 5\' 7"  (1.702 m)   Wt 240 lb 2  oz (108.9 kg)   SpO2 93%   BMI 37.61 kg/m   Constitutional: NAD, Well developed, Well nourished, alert and cooperative Head:  Normocephalic and atraumatic. Eyes:   PEERL, EOMI. No icterus. Conjunctiva pink. Respiratory: Respirations even and unlabored. Lungs clear to auscultation bilaterally.   No wheezes, crackles, or rhonchi.  Cardiovascular:  Regular rate and rhythm. No peripheral edema, cyanosis or pallor.  Gastrointestinal:  Soft, nondistended, nontender. No rebound or guarding. Normal bowel sounds. No appreciable masses or hepatomegaly. Rectal: Brooke CMA chaperone.  Small skin tag 6:00 on the anus.  No external hemorrhoids noted.  Small anal fissure at 12:00.  Normal sphincter tone.  Stool brown.  Anoscopy: somewhat limited due to patient resistance, no obvious hemorrhoids.  Msk:  Symmetrical without gross deformities. Without edema, no deformity or joint abnormality.  Neurologic:  Alert and  oriented x4;  grossly normal neurologically.  Skin:   Dry and intact without significant lesions or rashes. Psychiatric: Oriented to person, place and time. Demonstrates good judgement and reason without abnormal affect or behaviors.   RELEVANT LABS AND IMAGING: CBC    Component Value Date/Time   WBC 5.6 09/03/2022 1124   RBC 4.69 09/03/2022 1124   HGB 13.9 09/03/2022 1124   HCT 42.4 09/03/2022 1124   PLT 282.0 09/03/2022 1124   MCV 90.6 09/03/2022 1124   MCH 30.4 06/09/2019 1559   MCHC 32.9 09/03/2022 1124   RDW 13.2 09/03/2022 1124   LYMPHSABS 1.7 09/03/2022 1124   MONOABS 0.6 09/03/2022 1124   EOSABS 0.1 09/03/2022 1124   BASOSABS 0.0 09/03/2022 1124    CMP     Component Value Date/Time   NA 136 09/03/2022 1124   K 4.2 09/03/2022 1124   CL 102 09/03/2022 1124   CO2 26 09/03/2022 1124   GLUCOSE 99 09/03/2022 1124   BUN 9 09/03/2022 1124   CREATININE 0.67 09/03/2022 1124   CREATININE 0.78 06/09/2019 1559   CALCIUM 9.2 09/03/2022 1124   PROT 7.0 09/03/2022 1124    ALBUMIN 4.1 09/03/2022 1124   AST 57 (H) 09/03/2022 1124   ALT 99 (H) 09/03/2022 1124   ALKPHOS 84 09/03/2022 1124   BILITOT 0.3 09/03/2022 1124     Assessment/Plan:   Rectal bleeding Rectal pain Constipation, unspecified constipation type Constipation after changing to a high-protein diet leading to rectal pain and rectal bleeding.  Evidence of anal fissure on rectal exam and skin tag.  Anoscopy was limited due to resistance.  Suspect her pain and bleeding are likely from her persistent anal fissure.  However, she has not had a screening colonoscopy. - Diltiazem 2%/lidocaine 5% compound cream 3 times daily for 6 to 8 weeks - Recommend increasing fiber, increasing water, increasing exercise.  If no improvement can add on MiraLAX 1 capful daily and adjust dose as needed - Colonoscopy for further evaluation - I thoroughly discussed the procedure with the patient (at bedside) to include nature of the procedure, alternatives, benefits, and risks (including but not limited to bleeding, infection, perforation, anesthesia/cardiac pulmonary complications).  Patient verbalized understanding and gave verbal consent to proceed with procedure.   Screening for colon cancer No previous colon cancer screening.  No family history. - Schedule colonoscopy  Assigned to Dr. Rhea Belton today   Boone Master, PA-C [facility] 04/14/2023, 3:10 PM  Cc: Sheliah Hatch, MD

## 2023-04-14 NOTE — Patient Instructions (Addendum)
_______________________________________________________  If your blood pressure at your visit was 140/90 or greater, please contact your primary care physician to follow up on this.  _______________________________________________________  If you are age 46 or older, your body mass index should be between 23-30. Your Body mass index is 37.61 kg/m. If this is out of the aforementioned range listed, please consider follow up with your Primary Care Provider.  If you are age 83 or younger, your body mass index should be between 19-25. Your Body mass index is 37.61 kg/m. If this is out of the aformentioned range listed, please consider follow up with your Primary Care Provider.   ________________________________________________________  The [facility] providers would like to encourage you to use [facility] to communicate with providers for non-urgent requests or questions.  Due to long hold times on the telephone, sending your provider a message by [facility] may be a faster and more efficient way to get a response.  Please allow 48 business hours for a response.  Please remember that this is for non-urgent requests.  _______________________________________________________  We have sent the following medications to your pharmacy for you to pick up at your convenience: [facility] and you have been given the script p [phone]  It has been recommended to you by your physician that you have a(n) Colonoscopy completed. Per your request, we did not schedule the procedure(s) today. Please contact our office at [phone] should you decide to have the procedure completed. You will be scheduled for a pre-visit and procedure at that time.  It was a pleasure to see you today!  Thank you for trusting me with your gastrointestinal care!

## 2023-05-11 ENCOUNTER — Ambulatory Visit: Payer: 59 | Admitting: Gastroenterology

## 2023-05-12 ENCOUNTER — Other Ambulatory Visit: Payer: Self-pay | Admitting: Family Medicine

## 2023-05-12 DIAGNOSIS — F331 Major depressive disorder, recurrent, moderate: Secondary | ICD-10-CM

## 2023-06-07 ENCOUNTER — Other Ambulatory Visit: Payer: Self-pay | Admitting: Family Medicine

## 2023-06-07 DIAGNOSIS — E890 Postprocedural hypothyroidism: Secondary | ICD-10-CM

## 2023-06-08 ENCOUNTER — Other Ambulatory Visit: Payer: Self-pay | Admitting: Family Medicine

## 2023-06-08 DIAGNOSIS — E890 Postprocedural hypothyroidism: Secondary | ICD-10-CM

## 2023-06-08 DIAGNOSIS — F331 Major depressive disorder, recurrent, moderate: Secondary | ICD-10-CM

## 2023-06-22 ENCOUNTER — Other Ambulatory Visit: Payer: Self-pay

## 2023-06-22 DIAGNOSIS — F331 Major depressive disorder, recurrent, moderate: Secondary | ICD-10-CM

## 2023-06-22 MED ORDER — FLUOXETINE HCL 20 MG PO CAPS: 20.0000 mg | ORAL_CAPSULE | Freq: Every day | ORAL | 1 refills | Status: DC

## 2023-07-14 ENCOUNTER — Ambulatory Visit: Payer: Self-pay

## 2023-07-14 ENCOUNTER — Other Ambulatory Visit (HOSPITAL_BASED_OUTPATIENT_CLINIC_OR_DEPARTMENT_OTHER): Payer: Self-pay

## 2023-07-14 ENCOUNTER — Ambulatory Visit: Admitting: Family Medicine

## 2023-07-14 VITALS — BP 106/80 | HR 90 | Temp 98.3°F | Wt 251.0 lb

## 2023-07-14 DIAGNOSIS — H1032 Unspecified acute conjunctivitis, left eye: Secondary | ICD-10-CM | POA: Diagnosis not present

## 2023-07-14 DIAGNOSIS — H02846 Edema of left eye, unspecified eyelid: Secondary | ICD-10-CM

## 2023-07-14 MED ORDER — DOXYCYCLINE HYCLATE 100 MG PO TABS
100.0000 mg | ORAL_TABLET | Freq: Two times a day (BID) | ORAL | 0 refills | Status: DC
  Filled 2023-07-14: qty 14, 7d supply, fill #0

## 2023-07-14 MED ORDER — POLYMYXIN B-TRIMETHOPRIM 10000-0.1 UNIT/ML-% OP SOLN
2.0000 [drp] | Freq: Three times a day (TID) | OPHTHALMIC | 0 refills | Status: DC
  Filled 2023-07-14: qty 10, 30d supply, fill #0

## 2023-07-14 NOTE — Progress Notes (Signed)
   Subjective:    Patient ID: Martha Cervantes, female    DOB: [birth year], 48 y.o.   MRN: [id number]  HPI Eye pain- L eye.  Sxs started over the weekend and then developed redness and swelling of upper eyelid.  Eye became red- particularly in the corner.  + pain w/ eye movement.  No trauma.  No one at home w/ similar sxs.  No issues w/ R eye.     Review of Systems For ROS see HPI     Objective:   Physical Exam Vitals reviewed.  Constitutional:      General: She is not in acute distress.    Appearance: She is not ill-appearing.  HENT:     Head: Normocephalic and atraumatic.  Eyes:     General: Vision grossly intact. Gaze aligned appropriately.     Extraocular Movements: Extraocular movements intact.     Conjunctiva/sclera:     Left eye: Left conjunctiva is injected. No exudate.    Pupils: Pupils are equal, round, and reactive to light.  Musculoskeletal:     Cervical back: Neck supple.  Lymphadenopathy:     Cervical: No cervical adenopathy.  Skin:    General: Skin is warm and dry.     Findings: Erythema (swelling and redness of L upper eyelid, very TTP, unable to invert due to pain) present.  Neurological:     General: No focal deficit present.     Mental Status: She is alert and oriented to person, place, and time.     Cranial Nerves: No cranial nerve deficit.           Assessment & Plan:  Acute conjunctivitis w/ lid swelling- new.  Pt w/ obvious conjunctival redness and has some pain w/ eye movement.  Will start topical abx eye drops- Polytrim .  Discussed that she may have a stye of her upper lid but I am not able to get a good exam due to her level of pain.  Given the degree of redness and swelling of her eyelid, concern for possible cellulitis.  Due to allergy to Amox, will start Doxycycline .  Encouraged cool compresses for comfort and to help w/ swelling.  Reviewed supportive care and red flags that should prompt return.  Pt expressed understanding and is in agreement  w/ plan.

## 2023-07-14 NOTE — Patient Instructions (Signed)
 Follow up as needed or as scheduled Schedule your complete physical for sometime this summer START the eye drops as directed TAKE the Doxycycline  twice daily- take w/ food Apply cool compresses to help w/ swelling Try and avoid rubbing your eyes Call with any questions or concerns Hang in there!

## 2023-07-14 NOTE — Telephone Encounter (Signed)
  Chief Complaint: left eyelids painful swelling Symptoms: left eyelid swelling, pain in left eye, reddened sclera of left eye Frequency: x 3 days Pertinent Negatives: Patient denies eye discharge, stye, fever, itchy, trauma or injury to left eye Disposition: [] ED /[] Urgent Care (no appt availability in office) / [x] Appointment(In office/virtual)/ []  [place] [facility]/ [] Home Care/ [] Refused Recommended Disposition /[] [place] [facility]/ []  Follow-up with PCP Additional Notes: Patient unsure of what may have cause symptoms. She states a few years ago she had similar symptoms and it finally cleared up after steroids.  Copied from CRM [id number]. Topic: Clinical - Red Word Triage >> Jul 14, 2023  7:46 AM Kita Perish H wrote: [facility] that prompted transfer to Nurse Triage: Left eye swollen, painful, eyelid swollen on top and bottom and white part is red, first woke up seeing double. Reason for Disposition  MODERATE-SEVERE eyelid swelling on one side  (Exception: Due to a mosquito bite.)  Answer Assessment - Initial Assessment Questions 1. ONSET: "When did the swelling start?" (e.g., minutes, hours, days)     X 3 days. Worsening each day.  2. LOCATION: "What part of the eyelids is swollen?"     Left eye, top and bottom eyelid. Worse on the top.  3. SEVERITY: "How swollen is it?"     She states the eye is not swollen shut but it is partially obstructing and causing double vision because the eyelid is swollen.  4. ITCHING: "Is there any itching?" If Yes, ask: "How much?"   (Scale 1-10; mild, moderate or severe)     Denies.  5. PAIN: "Is the swelling painful to touch?" If Yes, ask: "How painful is it?"   (Scale 1-10; mild, moderate or severe)     Yes, 3/10.  6. FEVER: "Do you have a fever?" If Yes, ask: "What is it, how was it measured, and when did it start?"      Denies.  7. CAUSE: "What do you think is causing the swelling?"     Unsure. 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  8. RECURRENT SYMPTOM: "Have you had eyelid swelling before?" If Yes, ask: "When was the last time?" "What happened that time?"     Yes, she states a couple years ago she had. She states eventually she was prescribed a steroid.  9. OTHER SYMPTOMS: "Do you have any other symptoms?" (e.g., blurred vision, eye discharge, rash, runny nose)     Headache, redness to sclera of left eye.  10. PREGNANCY: "Is there any chance you are pregnant?" "When was your last menstrual period?"       LMP: 2 weeks ago.  Protocols used: Eye - Swelling-A-AH

## 2023-07-19 ENCOUNTER — Ambulatory Visit: Payer: Self-pay

## 2023-07-19 NOTE — Telephone Encounter (Signed)
 Chief Complaint: left eye pain Symptoms: pain and swelling Frequency: 8 days Pertinent Negatives: Patient denies vision changed Disposition: [] ED /[] Urgent Care (no appt availability in office) / [x] Appointment(In office/virtual)/ []  [place] [facility]/ [] Home Care/ [] Refused Recommended Disposition /[] [facility]/ [x]  Follow-up with PCP Additional Notes: pt states that pain in left eye is getting worse. States red painful and swollen. States the white of the eye is swollen now. Denies change in vision. Pt offered appt for tomorrow but wanted to wait to see Dr. Paulla Bossier. Patient states she has finished her antibiotics and eye is still not better.   Copied from CRM [id number]. Topic: Clinical - Red Word Triage >> Jul 19, 2023  2:44 PM Martha Cervantes wrote: Red Word that prompted transfer to Nurse Triage: eye pain getting worst Reason for Disposition  Eye pain present > 24 hours  Answer Assessment - Initial Assessment Questions 1. ONSET: "When did the pain start?" (e.g., minutes, hours, days)     4/19 or4/20 2. TIMING: "Does the pain come and go, or has it been constant since it started?" (e.g., constant, intermittent, fleeting)     sonstant 3. SEVERITY: "How bad is the pain?"   (Scale 1-10; mild, moderate or severe)   - MILD (1-3): doesn't interfere with normal activities    - MODERATE (4-7): interferes with normal activities or awakens from sleep    - SEVERE (8-10): excruciating pain and patient unable to do normal activities     3/10 4. LOCATION: "Where does it hurt?"  (e.g., eyelid, eye, cheekbone)     Left eye 5. CAUSE: "What do you think is causing the pain?"     unknown 6. VISION: "Do you have blurred vision or changes in your vision?"      no 7. EYE DISCHARGE: "Is there any discharge (pus) from the eye(s)?"  If Yes, ask: "What color is it?"      no 8. FEVER: "Do you have a fever?" If Yes, ask: "What is it, how was it measured, and when did it start?"      no 9. OTHER  SYMPTOMS: "Do you have any other symptoms?" (e.g., headache, nasal discharge, facial rash)     Headache by end of day  Protocols used: Eye Pain and Other Symptoms-A-AH

## 2023-07-19 NOTE — Telephone Encounter (Signed)
 Pt scheduled

## 2023-07-20 ENCOUNTER — Ambulatory Visit: Admitting: Student in an Organized Health Care Education/Training Program

## 2023-07-20 ENCOUNTER — Encounter: Payer: Self-pay | Admitting: Student in an Organized Health Care Education/Training Program

## 2023-07-20 ENCOUNTER — Encounter: Payer: Self-pay | Admitting: Family Medicine

## 2023-07-20 VITALS — BP 122/80 | HR 87 | Temp 98.3°F | Wt 251.0 lb

## 2023-07-20 DIAGNOSIS — H209 Unspecified iridocyclitis: Secondary | ICD-10-CM | POA: Insufficient documentation

## 2023-07-20 DIAGNOSIS — H00019 Hordeolum externum unspecified eye, unspecified eyelid: Secondary | ICD-10-CM | POA: Insufficient documentation

## 2023-07-20 DIAGNOSIS — H00014 Hordeolum externum left upper eyelid: Secondary | ICD-10-CM | POA: Diagnosis not present

## 2023-07-20 NOTE — Progress Notes (Signed)
   Acute Office Visit  Subjective:     Patient ID: Martha Cervantes, female    DOB: [birth year], 48 y.o.   MRN: [id number]  Chief Complaint  Patient presents with   Eye Pain    Was seen in for left eye last week, did receive antibiotics drops and oral antibiotic. Did not seem to help patient states both eye haves gotten worse and the white part of eyes seem to be " bubbling"     HPI  48 year old person here for follow-up of left eye redness.  She was in the clinic last Wednesday, had acute onset of left red eye, diagnosed with conjunctivitis and cellulitis of the upper eyelid.  Treated with oral antibiotics with doxycycline  and topical polymyxin.  Has not had much improvement in the redness of the eye.  Had improvement in the swelling of the eyelid.  Pain and discomfort are little bit better.  Not having eye discomfort.  No changes in the vision.  Still having discharge and crusting in the morning.  No photophobia.      Objective:    BP 122/80   Pulse 87   Temp 98.3 F (36.8 C) (Temporal)   Wt 251 lb (113.9 kg)   SpO2 97%   BMI 39.31 kg/m    Physical Exam  Gen: Well-appearing Eyes: Left eye is exquisitely red all around the sclera.  On the medial aspect it almost looks like a subconjunctival hemorrhage.  No hyphema.  Pupil is equal and reactive to light.  Retina looks normal.  No discomfort with extraocular motion.  The eyelid has mild swelling on the medial aspect.  There is a small blockage of the meibomian gland medial.  When I push on the eyes through the eyelids, they feel about equal pressure.       Assessment & Plan:   Problem List Items Addressed This Visit       Unprioritized   Uveitis of left eye - Primary   Acute right eye on the left side, no high risk symptoms of vision changes, no eyeball discomfort or pain.  She was treated for a conjunctivitis with 1 week of polymyxin drops but has had no improvement in the right eye.  On exam today I wonder if she has  uveitis, and this is an inflammatory condition.  The redness is more than what I would expect from allergic conjunctivitis.  I am going to refer the patient to ophthalmology for a slit-lamp exam to better evaluate for possible uveitis.      Relevant Orders   Ambulatory referral to Ophthalmology   Hordeolum   Exam of the left eyelid is consistent with a small hordeolum.  I can see a blocked meibomian duct on the medial upper eyelid.  The swelling of the eyelid is improving with antibiotics.  I do not think this is preseptal or orbital cellulitis as she has very little discomfort at this point.  This seems to have improved with the oral doxycycline  which she is going to finish.  At this point I recommended warm compresses to help with that duct blockage.  I think this is a secondary issue, and I do not think this is causing the redness in the eyeball.        No orders of the defined types were placed in this encounter.   No follow-ups on file.  Ether Hercules, MD

## 2023-07-20 NOTE — Assessment & Plan Note (Signed)
 Exam of the left eyelid is consistent with a small hordeolum.  I can see a blocked meibomian duct on the medial upper eyelid.  The swelling of the eyelid is improving with antibiotics.  I do not think this is preseptal or orbital cellulitis as she has very little discomfort at this point.  This seems to have improved with the oral doxycycline  which she is going to finish.  At this point I recommended warm compresses to help with that duct blockage.  I think this is a secondary issue, and I do not think this is causing the redness in the eyeball.

## 2023-07-20 NOTE — Telephone Encounter (Signed)
 Patient has been rescheduled with Dr Gayl Katos for today

## 2023-07-20 NOTE — Assessment & Plan Note (Signed)
 Acute right eye on the left side, no high risk symptoms of vision changes, no eyeball discomfort or pain.  She was treated for a conjunctivitis with 1 week of polymyxin drops but has had no improvement in the right eye.  On exam today I wonder if she has uveitis, and this is an inflammatory condition.  The redness is more than what I would expect from allergic conjunctivitis.  I am going to refer the patient to ophthalmology for a slit-lamp exam to better evaluate for possible uveitis.

## 2023-07-20 NOTE — Telephone Encounter (Signed)
 If she would like to be seen today, I support that.  She can be scheduled w/ Dr Gayl Katos in an available slot

## 2023-07-21 ENCOUNTER — Other Ambulatory Visit: Payer: Self-pay

## 2023-07-21 ENCOUNTER — Ambulatory Visit: Admitting: Family Medicine

## 2023-07-21 ENCOUNTER — Other Ambulatory Visit (HOSPITAL_BASED_OUTPATIENT_CLINIC_OR_DEPARTMENT_OTHER): Payer: Self-pay

## 2023-07-21 MED ORDER — PREDNISOLONE ACETATE 1 % OP SUSP
1.0000 [drp] | Freq: Three times a day (TID) | OPHTHALMIC | 1 refills | Status: DC
  Filled 2023-07-21: qty 5, 31d supply, fill #0

## 2023-07-21 MED ORDER — PREDNISONE 5 MG PO TABS
5.0000 mg | ORAL_TABLET | ORAL | 0 refills | Status: DC
  Filled 2023-07-21: qty 28, 12d supply, fill #0

## 2023-07-22 ENCOUNTER — Other Ambulatory Visit (HOSPITAL_BASED_OUTPATIENT_CLINIC_OR_DEPARTMENT_OTHER): Payer: Self-pay

## 2023-08-12 ENCOUNTER — Other Ambulatory Visit (HOSPITAL_BASED_OUTPATIENT_CLINIC_OR_DEPARTMENT_OTHER): Payer: Self-pay

## 2023-08-12 MED ORDER — PREDNISONE 10 MG PO TABS
ORAL_TABLET | ORAL | 1 refills | Status: DC
Start: 2023-08-12 — End: 2023-10-05
  Filled 2023-08-12: qty 40, 6d supply, fill #0

## 2023-08-13 ENCOUNTER — Other Ambulatory Visit: Payer: Self-pay | Admitting: Family Medicine

## 2023-08-13 ENCOUNTER — Other Ambulatory Visit

## 2023-08-13 DIAGNOSIS — H15009 Unspecified scleritis, unspecified eye: Secondary | ICD-10-CM

## 2023-08-13 NOTE — Addendum Note (Signed)
 Addended by: Adira Limburg E on: 08/13/2023 03:42 PM   Modules accepted: Orders

## 2023-08-13 NOTE — Addendum Note (Signed)
 Addended by: Nazir Hacker E on: 08/13/2023 03:37 PM   Modules accepted: Orders

## 2023-08-17 LAB — QUANTIFERON-TB GOLD PLUS
Mitogen-NIL: 8.23 [IU]/mL
NIL: 0.87 [IU]/mL
QuantiFERON-TB Gold Plus: NEGATIVE
TB1-NIL: 0.08 [IU]/mL
TB2-NIL: <0 [IU]/mL

## 2023-08-17 LAB — RPR: RPR Ser Ql: NONREACTIVE

## 2023-08-17 LAB — RHEUMATOID FACTOR: Rheumatoid fact SerPl-aCnc: <10 [IU]/mL (ref <14)

## 2023-08-20 ENCOUNTER — Telehealth: Payer: Self-pay

## 2023-08-20 LAB — CBC WITH DIFFERENTIAL/PLATELET
Absolute Lymphocytes: 3142 {cells}/uL (ref 850–3900)
Absolute Monocytes: 1109 {cells}/uL — ABNORMAL HIGH (ref 200–950)
Basophils Absolute: 53 {cells}/uL (ref 0–200)
Basophils Relative: 0.4 %
Eosinophils Absolute: 106 {cells}/uL (ref 15–500)
Eosinophils Relative: 0.8 %
HCT: 41.1 % (ref 35.0–45.0)
Hemoglobin: 13.8 g/dL (ref 11.7–15.5)
MCH: 30.3 pg (ref 27.0–33.0)
MCHC: 33.6 g/dL (ref 32.0–36.0)
MCV: 90.3 fL (ref 80.0–100.0)
MPV: 11.3 fL (ref 7.5–12.5)
Monocytes Relative: 8.4 %
Neutro Abs: 8791 {cells}/uL — ABNORMAL HIGH (ref 1500–7800)
Neutrophils Relative %: 66.6 %
Platelets: 307 10*3/uL (ref 140–400)
RBC: 4.55 10*6/uL (ref 3.80–5.10)
RDW: 12.4 % (ref 11.0–15.0)
Total Lymphocyte: 23.8 %
WBC: 13.2 10*3/uL — ABNORMAL HIGH (ref 3.8–10.8)

## 2023-08-20 LAB — HLA-B27 ANTIGEN: HLA-B27 Antigen: NEGATIVE

## 2023-08-20 LAB — FILARIA ANTIBODY (IGG4): Filaria Antibody (IgG4): 1.36 {index}

## 2023-08-20 LAB — CYCLIC CITRUL PEPTIDE ANTIBODY, IGG: Cyclic Citrullin Peptide Ab: <16 U

## 2023-08-20 LAB — C-REACTIVE PROTEIN: CRP: 3 mg/L (ref <8.0)

## 2023-08-20 LAB — B. BURGDORFI ANTIBODIES: B burgdorferi Ab IgG+IgM: <0.9 {index}

## 2023-08-20 LAB — ANGIOTENSIN CONVERTING ENZYME: Angiotensin-Converting Enzyme: 30 U/L (ref 9–67)

## 2023-08-20 LAB — LYSOZYME, SERUM: Lysozyme, Serum: 7.1 ug/mL (ref 5.0–11.0)

## 2023-08-20 LAB — ANCA SCREEN W REFLEX TITER: ANCA SCREEN: NEGATIVE

## 2023-08-20 LAB — ANA: Anti Nuclear Antibody (ANA): NEGATIVE

## 2023-08-20 NOTE — Telephone Encounter (Signed)
 Copied from CRM [id number]. Topic: Clinical - Lab/Test Results >> Aug 20, 2023 10:26 AM Jenice Mitts wrote: Reason for CRM: Armin Landing from [facility] is calling because they need the patients latest lab results sent over  fax# [phone]

## 2023-08-20 NOTE — Telephone Encounter (Signed)
Faxed to the requested number

## 2023-08-26 ENCOUNTER — Other Ambulatory Visit (HOSPITAL_BASED_OUTPATIENT_CLINIC_OR_DEPARTMENT_OTHER): Payer: Self-pay

## 2023-08-26 MED ORDER — PREDNISOLONE ACETATE 1 % OP SUSP
1.0000 [drp] | Freq: Three times a day (TID) | OPHTHALMIC | 1 refills | Status: DC
  Filled 2023-08-26: qty 5, 31d supply, fill #0

## 2023-09-01 ENCOUNTER — Ambulatory Visit: Payer: Self-pay | Admitting: Family Medicine

## 2023-09-06 ENCOUNTER — Encounter: Payer: Self-pay | Admitting: Family Medicine

## 2023-09-06 ENCOUNTER — Other Ambulatory Visit: Payer: Self-pay | Admitting: Family Medicine

## 2023-09-06 ENCOUNTER — Encounter: Payer: Self-pay | Admitting: Gastroenterology

## 2023-09-06 ENCOUNTER — Other Ambulatory Visit (HOSPITAL_BASED_OUTPATIENT_CLINIC_OR_DEPARTMENT_OTHER): Payer: Self-pay

## 2023-09-06 DIAGNOSIS — N6310 Unspecified lump in the right breast, unspecified quadrant: Secondary | ICD-10-CM

## 2023-09-13 ENCOUNTER — Other Ambulatory Visit (HOSPITAL_BASED_OUTPATIENT_CLINIC_OR_DEPARTMENT_OTHER): Payer: Self-pay

## 2023-09-23 ENCOUNTER — Ambulatory Visit (INDEPENDENT_AMBULATORY_CARE_PROVIDER_SITE_OTHER): Admitting: Family Medicine

## 2023-09-23 ENCOUNTER — Other Ambulatory Visit (HOSPITAL_BASED_OUTPATIENT_CLINIC_OR_DEPARTMENT_OTHER): Payer: Self-pay

## 2023-09-23 ENCOUNTER — Encounter: Payer: Self-pay | Admitting: Family Medicine

## 2023-09-23 ENCOUNTER — Other Ambulatory Visit: Payer: Self-pay

## 2023-09-23 VITALS — BP 130/78 | HR 92 | Temp 98.0°F | Ht 67.0 in | Wt 259.5 lb

## 2023-09-23 DIAGNOSIS — H15009 Unspecified scleritis, unspecified eye: Secondary | ICD-10-CM

## 2023-09-23 DIAGNOSIS — F331 Major depressive disorder, recurrent, moderate: Secondary | ICD-10-CM

## 2023-09-23 DIAGNOSIS — Z1159 Encounter for screening for other viral diseases: Secondary | ICD-10-CM | POA: Diagnosis not present

## 2023-09-23 DIAGNOSIS — E890 Postprocedural hypothyroidism: Secondary | ICD-10-CM | POA: Diagnosis not present

## 2023-09-23 DIAGNOSIS — Z0001 Encounter for general adult medical examination with abnormal findings: Secondary | ICD-10-CM | POA: Diagnosis not present

## 2023-09-23 DIAGNOSIS — Z0000 Encounter for general adult medical examination without abnormal findings: Secondary | ICD-10-CM

## 2023-09-23 DIAGNOSIS — H209 Unspecified iridocyclitis: Secondary | ICD-10-CM

## 2023-09-23 DIAGNOSIS — Z114 Encounter for screening for human immunodeficiency virus [HIV]: Secondary | ICD-10-CM

## 2023-09-23 LAB — BASIC METABOLIC PANEL WITH GFR
BUN: 14 mg/dL (ref 6–23)
CO2: 28 meq/L (ref 19–32)
Calcium: 9 mg/dL (ref 8.4–10.5)
Chloride: 103 meq/L (ref 96–112)
Creatinine, Ser: 0.73 mg/dL (ref 0.40–1.20)
GFR: 99 mL/min (ref >60.00)
Glucose, Bld: 110 mg/dL — ABNORMAL HIGH (ref 70–99)
Potassium: 4 meq/L (ref 3.5–5.1)
Sodium: 138 meq/L (ref 135–145)

## 2023-09-23 LAB — CBC WITH DIFFERENTIAL/PLATELET
Basophils Absolute: 0.1 10*3/uL (ref 0.0–0.1)
Basophils Relative: 0.7 % (ref 0.0–3.0)
Eosinophils Absolute: 0.1 10*3/uL (ref 0.0–0.7)
Eosinophils Relative: 1 % (ref 0.0–5.0)
HCT: 40.7 % (ref 36.0–46.0)
Hemoglobin: 13.7 g/dL (ref 12.0–15.0)
Lymphocytes Relative: 20.7 % (ref 12.0–46.0)
Lymphs Abs: 1.6 10*3/uL (ref 0.7–4.0)
MCHC: 33.6 g/dL (ref 30.0–36.0)
MCV: 89.8 fl (ref 78.0–100.0)
Monocytes Absolute: 0.7 10*3/uL (ref 0.1–1.0)
Monocytes Relative: 8.9 % (ref 3.0–12.0)
Neutro Abs: 5.5 10*3/uL (ref 1.4–7.7)
Neutrophils Relative %: 68.7 % (ref 43.0–77.0)
Platelets: 308 10*3/uL (ref 150.0–400.0)
RBC: 4.53 Mil/uL (ref 3.87–5.11)
RDW: 13.3 % (ref 11.5–15.5)
WBC: 7.9 10*3/uL (ref 4.0–10.5)

## 2023-09-23 LAB — HEMOGLOBIN A1C: Hgb A1c MFr Bld: 5.9 % (ref 4.6–6.5)

## 2023-09-23 LAB — HEPATIC FUNCTION PANEL
ALT: 17 U/L (ref 0–35)
AST: 13 U/L (ref 0–37)
Albumin: 4.1 g/dL (ref 3.5–5.2)
Alkaline Phosphatase: 65 U/L (ref 39–117)
Bilirubin, Direct: 0.1 mg/dL (ref 0.0–0.3)
Total Bilirubin: 0.3 mg/dL (ref 0.2–1.2)
Total Protein: 6.7 g/dL (ref 6.0–8.3)

## 2023-09-23 LAB — LIPID PANEL
Cholesterol: 187 mg/dL (ref 0–200)
HDL: 58.9 mg/dL (ref >39.00)
LDL Cholesterol: 105 mg/dL — ABNORMAL HIGH (ref 0–99)
NonHDL: 128.41
Total CHOL/HDL Ratio: 3
Triglycerides: 117 mg/dL (ref 0.0–149.0)
VLDL: 23.4 mg/dL (ref 0.0–40.0)

## 2023-09-23 LAB — VITAMIN D 25 HYDROXY (VIT D DEFICIENCY, FRACTURES): VITD: 35.06 ng/mL (ref 30.00–100.00)

## 2023-09-23 LAB — TSH: TSH: 2.5 u[IU]/mL (ref 0.35–5.50)

## 2023-09-23 MED ORDER — CLOTRIMAZOLE-BETAMETHASONE 1-0.05 % EX CREA
1.0000 | TOPICAL_CREAM | Freq: Every day | CUTANEOUS | 0 refills | Status: AC
  Filled 2023-09-23: qty 30, 14d supply, fill #0

## 2023-09-23 MED ORDER — FLUOXETINE HCL 20 MG PO CAPS
20.0000 mg | ORAL_CAPSULE | Freq: Every day | ORAL | 3 refills | Status: AC
  Filled 2023-09-23: qty 30, 30d supply, fill #0
  Filled 2023-10-25: qty 30, 30d supply, fill #1
  Filled 2023-11-24: qty 30, 30d supply, fill #2
  Filled 2024-01-23: qty 30, 30d supply, fill #3
  Filled 2024-02-22: qty 30, 30d supply, fill #4
  Filled 2024-04-06: qty 30, 30d supply, fill #5

## 2023-09-23 MED ORDER — LEVOTHYROXINE SODIUM 137 MCG PO TABS
137.0000 ug | ORAL_TABLET | Freq: Every day | ORAL | 1 refills | Status: DC
  Filled 2023-09-23: qty 30, 30d supply, fill #0
  Filled 2023-10-25: qty 30, 30d supply, fill #1
  Filled 2023-11-28: qty 30, 30d supply, fill #2
  Filled 2023-12-28: qty 30, 30d supply, fill #3
  Filled 2024-01-23: qty 30, 30d supply, fill #4
  Filled 2024-02-22: qty 30, 30d supply, fill #5

## 2023-09-23 NOTE — Progress Notes (Signed)
   Subjective:    Patient ID: Martha Cervantes, female    DOB: [birth year], 48 y.o.   MRN: [id number]  HPI CPE- colonoscopy scheduled, UTD on pap, mammo, Tdap  Patient Care Team    Relationship Specialty Notifications Start End  Mahlon Comer BRAVO, MD PCP - General Family Medicine  07/08/15     Health Maintenance  Topic Date Due   COVID-19 Vaccine (1) Never done   HIV Screening  Never done   Hepatitis C Screening  Never done   Hepatitis B Vaccines (1 of 3 - 19+ 3-dose series) Never done   HPV VACCINES (1 - Risk 3-dose SCDM series) Never done   Colonoscopy  Never done   INFLUENZA VACCINE  10/22/2023   Cervical Cancer Screening (HPV/Pap Cotest)  11/03/2024   DTaP/Tdap/Td (3 - Td or Tdap) 09/02/2032   Meningococcal B Vaccine  Aged Out      Review of Systems Patient reports no hearing changes, adenopathy,fever, persistant/recurrent hoarseness , swallowing issues, chest pain, palpitations, edema, persistant/recurrent cough, hemoptysis, dyspnea (rest/exertional/paroxysmal nocturnal), gastrointestinal bleeding (melena, rectal bleeding), abdominal pain, significant heartburn, bowel changes, GU symptoms (dysuria, hematuria, incontinence), Gyn symptoms (abnormal  bleeding, pain),  syncope, focal weakness, memory loss, numbness & tingling, skin/hair/nail changes, abnormal bruising or bleeding, anxiety, or depression.   + 9 lb weight gain + persistent scleritis/uveitis- saw Dr [employer] office and had lab work done.  A lot of lab work.  All was WNL w/ exception of mildly elevated WBC.  Since there was no obvious cause, Ophthalmology released her back to care of PCP.  She would like a 2nd opinion    Objective:   Physical Exam General Appearance:    Alert, cooperative, no distress, appears stated age, obese  Head:    Normocephalic, without obvious abnormality, atraumatic  Eyes:    PERRL, EOM's intact both eyes, L eye w/ persistent scleral redness  Ears:    Normal TM's and external ear canals,  both ears  Nose:   Nares normal, septum midline, mucosa normal, no drainage    or sinus tenderness  Throat:   Lips, mucosa, and tongue normal; teeth and gums normal  Neck:   Supple, symmetrical, trachea midline, no adenopathy;    Thyroid : no enlargement/tenderness/nodules  Back:     Symmetric, no curvature, ROM normal, no CVA tenderness  Lungs:     Clear to auscultation bilaterally, respirations unlabored  Chest Wall:    No tenderness or deformity   Heart:    Regular rate and rhythm, S1 and S2 normal, no murmur, rub   or gallop  Breast Exam:    Deferred to GYN  Abdomen:     Soft, non-tender, bowel sounds active all four quadrants,    no masses, no organomegaly  Genitalia:    Deferred to GYN  Rectal:    Extremities:   Extremities normal, atraumatic, no cyanosis or edema  Pulses:   2+ and symmetric all extremities  Skin:   Skin color, texture, turgor normal, no rashes or lesions  Lymph nodes:   Cervical, supraclavicular, and axillary nodes normal  Neurologic:   CNII-XII intact, normal strength, sensation and reflexes    throughout          Assessment & Plan:

## 2023-09-23 NOTE — Patient Instructions (Signed)
 Follow up in 1 year or as needed We'll notify you of your lab results and make any changes if needed Continue to work on healthy diet and regular exercise- you can do it! Call your insurance company and ask if the cover [employer]ep[employer] or [facility] for weight loss- message me and let me know what they say We'll call you to schedule your 2nd Opinion for the eye Call with any questions or concerns Stay Safe!  Stay Healthy! Have a great summer!!!

## 2023-09-24 LAB — HIV ANTIBODY (ROUTINE TESTING W REFLEX): HIV 1&2 Ab, 4th Generation: NONREACTIVE

## 2023-09-24 LAB — HEPATITIS C ANTIBODY: Hepatitis C Ab: NONREACTIVE

## 2023-09-27 ENCOUNTER — Ambulatory Visit: Payer: Self-pay | Admitting: Family Medicine

## 2023-09-27 ENCOUNTER — Encounter: Payer: Self-pay | Admitting: Family Medicine

## 2023-09-27 NOTE — Progress Notes (Signed)
 Pt has reviewed labs via MyChart

## 2023-09-27 NOTE — Assessment & Plan Note (Signed)
Referral for 2nd opinion placed.

## 2023-09-27 NOTE — Assessment & Plan Note (Signed)
 Pt's PE WNL w/ exception of BMI and ongoing L eye redness.  UTD on pap, mammo, Tdap.  Colonoscopy scheduled.  Check labs.  Anticipatory guidance provided.

## 2023-10-05 ENCOUNTER — Encounter: Payer: Self-pay | Admitting: Family Medicine

## 2023-10-05 ENCOUNTER — Other Ambulatory Visit: Payer: Self-pay | Admitting: Family

## 2023-10-05 ENCOUNTER — Other Ambulatory Visit (HOSPITAL_BASED_OUTPATIENT_CLINIC_OR_DEPARTMENT_OTHER): Payer: Self-pay

## 2023-10-05 MED ORDER — PREDNISONE 10 MG PO TABS
ORAL_TABLET | ORAL | 1 refills | Status: DC
  Filled 2023-10-05: qty 40, 10d supply, fill #0

## 2023-10-05 NOTE — Telephone Encounter (Signed)
 Patient lost prednisone  and is requesting a replacement, please advise

## 2023-10-08 ENCOUNTER — Other Ambulatory Visit (HOSPITAL_BASED_OUTPATIENT_CLINIC_OR_DEPARTMENT_OTHER): Payer: Self-pay

## 2023-10-08 ENCOUNTER — Ambulatory Visit

## 2023-10-08 ENCOUNTER — Encounter: Payer: Self-pay | Admitting: Gastroenterology

## 2023-10-08 VITALS — Ht 67.0 in | Wt 250.0 lb

## 2023-10-08 DIAGNOSIS — Z1211 Encounter for screening for malignant neoplasm of colon: Secondary | ICD-10-CM

## 2023-10-08 MED ORDER — NA SULFATE-K SULFATE-MG SULF 17.5-3.13-1.6 GM/177ML PO SOLN
1.0000 | Freq: Once | ORAL | 0 refills | Status: AC
  Filled 2023-10-08: qty 354, 1d supply, fill #0

## 2023-10-08 NOTE — Progress Notes (Signed)
 Pt's name and DOB verified at the beginning of the pre-visit wit 2 identifiers  Pt denies any difficulty with ambulating,sitting, laying down or rolling side to side  Pt has no issues moving head neck or swallowing  No egg or soy allergy known to patient   No issues known to pt with past sedation with any surgeries or procedures  No FH of Malignant Hyperthermia  Pt is not on home 02   Pt is not on blood thinners   Pt denies issues with constipation   Pt is not on dialysis  Pt denise any abnormal heart rhythms   Pt denies any upcoming cardiac testing  Patient's chart reviewed by Norleen Schillings CNRA prior to pre-visit and patient appropriate for the [facility].  Pre-visit completed and red dot placed by patient's name on their procedure day (on provider's schedule).    Visit by phone  Pt states weight is 250 lb  IInstructions reviewed. Pt given  both [facility] main # and MD on call # prior to instructions.  Pt states understanding of instructions. Instructed pt to review instructions again prior to procedure and call main # given if has questions.. Pt states they will.   Instructed pt on where to find instructions on My Chart.

## 2023-10-22 ENCOUNTER — Encounter: Payer: Self-pay | Admitting: Gastroenterology

## 2023-10-22 ENCOUNTER — Ambulatory Visit: Admitting: Gastroenterology

## 2023-10-22 VITALS — BP 109/67 | HR 60 | Temp 97.9°F | Resp 11 | Ht 67.0 in | Wt 250.0 lb

## 2023-10-22 DIAGNOSIS — K621 Rectal polyp: Secondary | ICD-10-CM | POA: Diagnosis not present

## 2023-10-22 DIAGNOSIS — K5730 Diverticulosis of large intestine without perforation or abscess without bleeding: Secondary | ICD-10-CM | POA: Diagnosis not present

## 2023-10-22 DIAGNOSIS — Z1211 Encounter for screening for malignant neoplasm of colon: Secondary | ICD-10-CM

## 2023-10-22 DIAGNOSIS — D128 Benign neoplasm of rectum: Secondary | ICD-10-CM

## 2023-10-22 DIAGNOSIS — K648 Other hemorrhoids: Secondary | ICD-10-CM

## 2023-10-22 MED ORDER — SODIUM CHLORIDE 0.9 % IV SOLN: 500.0000 mL | Freq: Once | INTRAVENOUS | Status: DC

## 2023-10-22 NOTE — Progress Notes (Signed)
 Report to PACU, RN, vss, BBS= Clear.

## 2023-10-22 NOTE — Op Note (Signed)
 [facility] Patient Name: Martha Cervantes Procedure Date: 10/22/2023 12:55 PM MRN: [id number] Endoscopist: Elspeth P. Leigh , MD, [id number] Age: 48 Referring MD:  Date of Birth: Jun 23, 1975 Gender: Female Account #: [id number] Procedure:                Colonoscopy Indications:              Screening for colorectal malignant neoplasm, This                            is the patient's first colonoscopy - history of                            anal fissure causing symptoms at the last visit,                            since treated and symptoms resolved. Medicines:                Monitored Anesthesia Care Procedure:                Pre-Anesthesia Assessment:                           - Prior to the procedure, a History and Physical                            was performed, and patient medications and                            allergies were reviewed. The patient's tolerance of                            previous anesthesia was also reviewed. The risks                            and benefits of the procedure and the sedation                            options and risks were discussed with the patient.                            All questions were answered, and informed consent                            was obtained. Prior Anticoagulants: The patient has                            taken no anticoagulant or antiplatelet agents. ASA                            Grade Assessment: II - A patient with mild systemic                            disease. After reviewing the risks and benefits,  the patient was deemed in satisfactory condition to                            undergo the procedure.                           After obtaining informed consent, the colonoscope                            was passed under direct vision. Throughout the                            procedure, the patient's blood pressure, pulse, and                            oxygen  saturations were monitored continuously. The                            Olympus Scope SN [id number] was introduced through the                            anus and advanced to the the cecum, identified by                            appendiceal orifice and ileocecal valve. The                            colonoscopy was performed without difficulty. The                            patient tolerated the procedure well. The quality                            of the bowel preparation was good. The ileocecal                            valve, appendiceal orifice, and rectum were                            photographed. Scope In: 1:00:45 PM Scope Out: 1:16:48 PM Scope Withdrawal Time: 0 hours 12 minutes 55 seconds  Total Procedure Duration: 0 hours 16 minutes 3 seconds  Findings:                 The perianal and digital rectal examinations were                            normal.                           Multiple small-mouthed diverticula were found in                            the sigmoid colon.  A 3 mm polyp was found in the rectum. The polyp was                            sessile. The polyp was removed with a cold snare.                            Resection and retrieval were complete.                           Internal hemorrhoids were found during                            retroflexion. The hemorrhoids were small.                           The exam was otherwise without abnormality. Complications:            No immediate complications. Estimated blood loss:                            Minimal. Estimated Blood Loss:     Estimated blood loss was minimal. Impression:               - Diverticulosis in the sigmoid colon.                           - One 3 mm polyp in the rectum, removed with a cold                            snare. Resected and retrieved.                           - Internal hemorrhoids.                           - The examination was otherwise  normal. Recommendation:           - Patient has a contact number available for                            emergencies. The signs and symptoms of potential                            delayed complications were discussed with the                            patient. Return to normal activities tomorrow.                            Written discharge instructions were provided to the                            patient.                           - Resume previous diet.                           -  Continue present medications.                           - Await pathology results. Elspeth P. Naomie Crow, MD 10/22/2023 1:20:51 PM This report has been signed electronically.

## 2023-10-22 NOTE — Progress Notes (Signed)
 [facility] History and Physical   Primary Care Physician:  Martha Comer BRAVO, MD   Reason for Procedure:   Screening for colon cancer, h/o rectal bleeding / fissure  Plan:    colonoscopy     HPI: Martha Cervantes is a 48 y.o. female  here for colonoscopy screening - first exam. She has had some rectal bleeding attributed to an anal fissure in the past. Symptoms have resolved.   No family history of colon cancer known. Otherwise feels well without any cardiopulmonary symptoms.   I have discussed risks / benefits of anesthesia and endoscopic procedure with Martha Cervantes and they wish to proceed with the exams as outlined today.    Past Medical History:  Diagnosis Date   Cancer of kidney (HCC)    Clotting disorder (HCC)    Factor V Leiden (HCC)   Depression    Fatty liver    Thyroid  disease     Past Surgical History:  Procedure Laterality Date   EXTERNAL EAR SURGERY     LAPAROSCOPIC PARTIAL NEPHRECTOMY Right 08/22/2021   In [place]   THYROIDECTOMY  03/23/2000   TYMPANOPLASTY      Prior to Admission medications   Medication Sig Start Date End Date Taking? Authorizing Provider  cholecalciferol (VITAMIN D3) 25 MCG (1000 UNIT) tablet Take 1,000 Units by mouth daily. Patient taking differently: Take 2,000 Units by mouth daily.   Yes [provider]  FLUoxetine  (PROZAC ) 20 MG capsule Take 1 capsule (20 mg total) by mouth daily. 09/23/23  Yes Tabori, Martha E, MD  levothyroxine  (SYNTHROID ) 137 MCG tablet Take 1 tablet (137 mcg total) by mouth daily before breakfast. 09/23/23  Yes Tabori, Martha E, MD  Multiple Vitamins-Minerals (MULTIVITAMIN ADULTS PO) Take by mouth.   Yes [provider]  AMBULATORY NON FORMULARY MEDICATION Medication Name: Medication Name: Diltiazem 2% gel/Lidocaine 5% Apply a small amount to the inside and external rectum area 3 times a day for 6-8 weeks. 04/14/23   McMichael, Martha Cervantes  clotrimazole -betamethasone   (LOTRISONE ) cream Apply 1 Application topically daily. Patient not taking: Reported on 10/22/2023 09/23/23   Tabori, Martha E, MD  nystatin  (MYCOSTATIN /NYSTOP ) powder Apply 1 Application topically 3 (three) times daily. Patient not taking: Reported on 10/22/2023 02/24/23   Martha Comer BRAVO, MD  predniSONE  (DELTASONE ) 10 MG tablet Take 1 tablet by mouth FOLLOW SCHEDULE PROVIDED IN CLINIC Patient not taking: Reported on 10/08/2023 10/05/23   Martha Martha NOVAK, FNP    Current Outpatient Medications  Medication Sig Dispense Refill   cholecalciferol (VITAMIN D3) 25 MCG (1000 UNIT) tablet Take 1,000 Units by mouth daily. (Patient taking differently: Take 2,000 Units by mouth daily.)     FLUoxetine  (PROZAC ) 20 MG capsule Take 1 capsule (20 mg total) by mouth daily. 90 capsule 3   levothyroxine  (SYNTHROID ) 137 MCG tablet Take 1 tablet (137 mcg total) by mouth daily before breakfast. 90 tablet 1   Multiple Vitamins-Minerals (MULTIVITAMIN ADULTS PO) Take by mouth.     AMBULATORY NON FORMULARY MEDICATION Medication Name: Medication Name: Diltiazem 2% gel/Lidocaine 5% Apply a small amount to the inside and external rectum area 3 times a day for 6-8 weeks. 30 g 0   clotrimazole -betamethasone  (LOTRISONE ) cream Apply 1 Application topically daily. (Patient not taking: Reported on 10/22/2023) 30 g 0   nystatin  (MYCOSTATIN /NYSTOP ) powder Apply 1 Application topically 3 (three) times daily. (Patient not taking: Reported on 10/22/2023) 60 g 0   predniSONE  (DELTASONE ) 10 MG tablet Take 1  tablet by mouth FOLLOW SCHEDULE PROVIDED IN CLINIC (Patient not taking: Reported on 10/08/2023) 40 tablet 1   Current Facility-Administered Medications  Medication Dose Route Frequency Provider Last Rate Last Admin   0.9 %  sodium chloride infusion  500 mL Intravenous Once Martha Prabhu, Martha SQUIBB, MD        Allergies as of 10/22/2023 - Review Complete 10/22/2023  Allergen Reaction Noted   Amoxicillin Rash, Itching, and Nausea And Vomiting  09/27/2015    Family History  Problem Relation Age of Onset   Hyperlipidemia Mother    Depression Mother    Factor V Leiden deficiency Father    Cancer Sister        thyroid    Uterine cancer Sister    Cancer Sister        thyroid    Diabetes Maternal Grandmother    Cancer Maternal Grandmother        thyroid    Diabetes Maternal Grandfather    Diabetes Paternal Grandmother    Diabetes Paternal Grandfather    Colon cancer Neg Hx    Colon polyps Neg Hx    Esophageal cancer Neg Hx    Stomach cancer Neg Hx    Rectal cancer Neg Hx     Social History   Socioeconomic History   Marital status: Married    Spouse name: Not on file   Number of children: Not on file   Years of education: Not on file   Highest education level: Bachelor's degree (Cervantes.g., BA, AB, BS)  Occupational History   Not on file  Tobacco Use   Smoking status: Never   Smokeless tobacco: Never  Vaping Use   Vaping status: Never Used  Substance and Sexual Activity   Alcohol use: Yes    Comment: 0-1 glass of wine a month   Drug use: No   Sexual activity: Yes    Partners: Male    Birth control/protection: Other-see comments    Comment: 1st intercourse- 24, partners- 2, married- 13 yrs, husband vasectomy  Other Topics Concern   Not on file  Social History Narrative   Not on file   Social Drivers of Health   Financial Resource Strain: Low Risk  (09/19/2023)   Overall Financial Resource Strain (CARDIA)    Difficulty of Paying Living Expenses: Not very hard  Food Insecurity: No Food Insecurity (09/19/2023)   Hunger Vital Sign    Worried About Running Out of Food in the Last Year: Never true    Ran Out of Food in the Last Year: Never true  Transportation Needs: No Transportation Needs (09/19/2023)   PRAPARE - Administrator, Civil Service (Medical): No    Lack of Transportation (Non-Medical): No  Physical Activity: Insufficiently Active (09/19/2023)   Exercise Vital Sign    Days of Exercise per  Week: 1 day    Minutes of Exercise per Session: 20 min  Stress: Stress Concern Present (09/19/2023)   [employer] of Occupational Health - Occupational Stress Questionnaire    Feeling of Stress: To some extent  Social Connections: Moderately Integrated (09/19/2023)   Social Connection and Isolation Panel    Frequency of Communication with Friends and Family: Three times a week    Frequency of Social Gatherings with Friends and Family: Once a week    Attends Religious Services: Never    Database administrator or Organizations: Yes    Attends Banker Meetings: 1 to 4 times per year    Marital Status: Married  Intimate Partner Violence: Not At Risk (11/12/2022)   Humiliation, Afraid, Rape, and Kick questionnaire    Fear of Current or Ex-Partner: No    Emotionally Abused: No    Physically Abused: No    Sexually Abused: No    Review of Systems: All other review of systems negative except as mentioned in the HPI.  Physical Exam: Vital signs BP (!) 123/51   Pulse 77   Temp 97.9 F (36.6 C)   Ht 5' 7 (1.702 m)   Wt 250 lb (113.4 kg)   SpO2 96%   BMI 39.16 kg/m   General:   Alert,  Well-developed, pleasant and cooperative in NAD Lungs:  Clear throughout to auscultation.   Heart:  Regular rate and rhythm Abdomen:  Soft, nontender and nondistended.   Neuro/Psych:  Alert and cooperative. Normal mood and affect. A and O x 3  Marcey Naval, MD [facility]

## 2023-10-22 NOTE — Progress Notes (Signed)
   Established Patient Office Visit  Subjective   Patient ID: Martha Cervantes, female    DOB: 27-Feb-1976  Age: 48 y.o. MRN: [id number]  Chief Complaint  Patient presents with   Colonoscopy    Screening/ Tabori    HPI    ROS    Objective:     BP 95/65   Pulse 61   Temp 97.9 F (36.6 C)   Resp (!) 21   Ht 5' 7 (1.702 m)   Wt 250 lb (113.4 kg)   SpO2 100%   BMI 39.16 kg/m    Physical Exam   No results found for any visits on 10/22/23.    The 10-year ASCVD risk score (Arnett DK, et al., 2019) is: 0.4%    Assessment & Plan:   Problem List Items Addressed This Visit   None Visit Diagnoses       Special screening for malignant neoplasms, colon    -  Primary   Relevant Medications   0.9 %  sodium chloride infusion   Other Relevant Orders   Diet NPO   Hypoglycemia/Hyperglycemia protocol   Emergency Medications   ECG and pulse monitoring    Monitor NIBP   Notify physician   Monitor O2 SATs   NPO status   Vital signs   Monitor NIBP, ECG and PSA02   Notify physician   Discharge home with responsible adult.   Discharge instructions   Colonoscopy: verify informed consent       No follow-ups on file.    Ninette Bernice Morrison, RN

## 2023-10-22 NOTE — Progress Notes (Signed)
 Pt's states no medical or surgical changes since previsit or office visit.

## 2023-10-22 NOTE — Progress Notes (Signed)
 Called to room to assist during endoscopic procedure.  Patient ID and intended procedure confirmed with present staff. Received instructions for my participation in the procedure from the performing physician.

## 2023-10-22 NOTE — Patient Instructions (Signed)

## 2023-10-25 ENCOUNTER — Telehealth: Payer: Self-pay

## 2023-10-25 NOTE — Telephone Encounter (Signed)
  Follow up Call-     10/22/2023   12:42 PM  Call back number  Post procedure Call Back phone  # [phone]  Permission to leave phone message Yes     Patient questions:  Do you have a fever, pain , or abdominal swelling? No. Pain Score  0 *  Have you tolerated food without any problems? Yes.    Have you been able to return to your normal activities? Yes.    Do you have any questions about your discharge instructions: Diet   No. Medications  No. Follow up visit  No.  Do you have questions or concerns about your Care? No.  Actions: * If pain score is 4 or above: No action needed, pain <4.

## 2023-10-26 ENCOUNTER — Telehealth: Payer: Self-pay

## 2023-10-26 NOTE — Telephone Encounter (Signed)
 Left VM for patient to call back about open referral for Ophthalmology

## 2023-10-27 ENCOUNTER — Ambulatory Visit: Payer: Self-pay | Admitting: Gastroenterology

## 2023-10-27 LAB — SURGICAL PATHOLOGY

## 2023-10-28 NOTE — Telephone Encounter (Signed)
 Patient was calling back from 8/5 about referral for eye appointment and states her eye is feeling  better and no longer needs this referral or appointment with [facility].

## 2023-10-28 NOTE — Telephone Encounter (Unsigned)
 Copied from CRM #[id number]. Topic: Referral - Status >> Oct 28, 2023  2:19 PM Antwanette L wrote: Reason for CRM: Patient had a missed call on 8/5 Gzermani about her ophthalmology referral (09/21/33). The patient called [facility] numerous times, but no one never answered the phone. Patient said her left eye is better and no longer needs an appt at [facility].

## 2023-11-02 ENCOUNTER — Ambulatory Visit
Admission: RE | Admit: 2023-11-02 | Discharge: 2023-11-02 | Disposition: A | Discharge status: Home / Self Care | Source: Ambulatory Visit | Attending: Family Medicine | Admitting: Family Medicine

## 2023-11-02 DIAGNOSIS — N6310 Unspecified lump in the right breast, unspecified quadrant: Secondary | ICD-10-CM

## 2023-11-23 ENCOUNTER — Telehealth: Payer: Self-pay

## 2023-11-23 DIAGNOSIS — E669 Obesity, unspecified: Secondary | ICD-10-CM

## 2023-11-23 NOTE — Telephone Encounter (Signed)
 Patient would like a referral for a Sleep Study.

## 2023-11-23 NOTE — Telephone Encounter (Signed)
 Copied from CRM [id number]. Topic: Referral - Request for Referral >> Nov 23, 2023  4:00 PM Paige D wrote: Did the patient discuss referral with their provider in the last year? No (If No - schedule appointment) (If Yes - send message)  Appointment offered? Yes  Type of order/referral and detailed reason for visit: Sleep study   Preference of office, provider, location: Not yet   If referral order, have you been seen by this specialty before? No (If Yes, this issue or another issue? When? Where?  Can we respond through MyChart? Yes

## 2023-11-24 ENCOUNTER — Other Ambulatory Visit (HOSPITAL_BASED_OUTPATIENT_CLINIC_OR_DEPARTMENT_OTHER): Payer: Self-pay

## 2023-11-29 ENCOUNTER — Other Ambulatory Visit (HOSPITAL_BASED_OUTPATIENT_CLINIC_OR_DEPARTMENT_OTHER): Payer: Self-pay

## 2023-11-29 NOTE — Addendum Note (Signed)
 Addended by: Rekisha Welling E on: 11/29/2023 12:35 PM   Modules accepted: Orders

## 2023-11-29 NOTE — Telephone Encounter (Signed)
 Called patient and left detailed message. Okay per DPR.

## 2023-11-29 NOTE — Telephone Encounter (Signed)
Referral placed for sleep study.

## 2023-12-08 ENCOUNTER — Ambulatory Visit: Admitting: Obstetrics & Gynecology

## 2023-12-09 ENCOUNTER — Ambulatory Visit: Admitting: Obstetrics & Gynecology

## 2023-12-09 ENCOUNTER — Encounter: Payer: Self-pay | Admitting: Obstetrics & Gynecology

## 2023-12-09 VITALS — BP 119/80 | HR 86 | Ht 67.0 in | Wt 262.4 lb

## 2023-12-09 DIAGNOSIS — Z1331 Encounter for screening for depression: Secondary | ICD-10-CM | POA: Diagnosis not present

## 2023-12-09 DIAGNOSIS — Z01419 Encounter for gynecological examination (general) (routine) without abnormal findings: Secondary | ICD-10-CM | POA: Diagnosis not present

## 2023-12-09 NOTE — Progress Notes (Signed)
 WELL-WOMAN EXAMINATION Patient name: Martha Cervantes MRN [id number]  Date of birth: Mar 15, 1976 Chief Complaint:   Annual Exam  History of Present Illness:   Martha Cervantes is a 48 y.o. G2P2  female being seen today for a routine well-woman exam.   -Still having periods- every month- slightly heavier but still very manageable.  Lasting about 7 days, but very light.  Denies pelvic or abdominal pain.  Notes h.o vulvar dermatitis- still having occasional symptoms but tolerabe  No acute gyn concerns   Patient's last menstrual period was 12/02/2023.   Last pap 10/2021.  Last mammogram: 10/2023. Last colonoscopy: 10/2023     12/09/2023    3:38 PM 09/03/2022   10:52 AM 10/17/2021   11:11 AM 05/19/2021    8:17 AM 01/15/2020    2:16 PM  Depression screen PHQ 2/9  Decreased Interest 0 0 0 0 0  Down, Depressed, Hopeless 0 0 0 0 0  PHQ - 2 Score 0 0 0 0 0  Altered sleeping 0 0 0 0 1  Tired, decreased energy 0 0 1 0 3  Change in appetite 0 0 0 0 3  Feeling bad or failure about yourself  0 0 0 0 0  Trouble concentrating 0 0 0 0 3  Moving slowly or fidgety/restless 0 0 0 0 0  Suicidal thoughts 0 0 0 0   PHQ-9 Score 0 0 1 0 10  Difficult doing work/chores  Not difficult at all Not difficult at all Not difficult at all Somewhat difficult      Review of Systems:   Pertinent items are noted in HPI Denies any headaches, blurred vision, fatigue, shortness of breath, chest pain, abdominal pain, bowel movements, urination, or intercourse unless otherwise stated above.  Pertinent History Reviewed:  Reviewed past medical,surgical, social and family history.  Reviewed problem list, medications and allergies. Physical Assessment:   Vitals:   12/09/23 1536  BP: 119/80  Pulse: 86  Weight: 262 lb 6.4 oz (119 kg)  Height: 5' 7 (1.702 m)  Body mass index is 41.1 kg/m.        Physical Examination:   General appearance - well appearing, and in no distress  Mental status - alert,  oriented to person, place, and time  Psych:  She has a normal mood and affect  Skin - warm and dry, normal color, no suspicious lesions noted  Chest - effort normal, all lung fields clear to auscultation bilaterally  Heart - normal rate and regular rhythm  Neck:  midline trachea, no thyromegaly or nodules  Breasts - breasts appear normal, no suspicious masses, no skin or nipple changes or  axillary nodes  Abdomen - soft, nontender, nondistended, no masses or organomegaly  Pelvic - VULVA: normal appearing vulva with no masses, tenderness or lesions  VAGINA: normal appearing vagina with normal color and discharge, no lesions  CERVIX: normal appearing cervix without discharge or lesions, no CMT  UTERUS: uterus is felt to be normal size, shape, consistency and nontender   ADNEXA: No adnexal masses or tenderness noted.   Extremities:  No swelling or varicosities noted  Chaperone: Latisha Cresenzo     Assessment & Plan:  1) Well-Woman Exam Pap up to date- 2023, reviewed ASCCP guidelines -mammogram completed this year  2) Vulvar dermatitis/Intertrigo -using lotrison as needed  No orders of the defined types were placed in this encounter.   Meds: No orders of the defined types were placed in this encounter.  Follow-up: Return in about 1 year (around 12/08/2024) for Annual.   Brallan Denio, DO Attending Obstetrician & Gynecologist, [facility], [facility]

## 2023-12-29 ENCOUNTER — Other Ambulatory Visit (HOSPITAL_BASED_OUTPATIENT_CLINIC_OR_DEPARTMENT_OTHER): Payer: Self-pay

## 2024-01-24 ENCOUNTER — Other Ambulatory Visit (HOSPITAL_BASED_OUTPATIENT_CLINIC_OR_DEPARTMENT_OTHER): Payer: Self-pay

## 2024-04-06 ENCOUNTER — Other Ambulatory Visit (HOSPITAL_BASED_OUTPATIENT_CLINIC_OR_DEPARTMENT_OTHER): Payer: Self-pay

## 2024-04-06 ENCOUNTER — Other Ambulatory Visit: Payer: Self-pay | Admitting: Family Medicine

## 2024-04-06 ENCOUNTER — Other Ambulatory Visit: Payer: Self-pay

## 2024-04-06 DIAGNOSIS — E890 Postprocedural hypothyroidism: Secondary | ICD-10-CM

## 2024-04-06 MED ORDER — LEVOTHYROXINE SODIUM 137 MCG PO TABS
137.0000 ug | ORAL_TABLET | Freq: Every day | ORAL | 1 refills | Status: AC
  Filled 2024-04-06: qty 30, 30d supply, fill #0
